# Patient Record
Sex: Male | Born: 1980 | Hispanic: Yes | Marital: Single | Smoking: Never smoker
Health system: Southern US, Community
[De-identification: ages and names within clinical notes are randomized; demographics above are authoritative.]

---

## 2017-06-02 ENCOUNTER — Inpatient Hospital Stay (HOSPITAL_COMMUNITY)
Admission: EM | Admit: 2017-06-02 | Discharge: 2017-06-06 | DRG: 392 | Disposition: A | Discharge status: Home / Self Care | Payer: Self-pay | Attending: Internal Medicine | Admitting: Internal Medicine

## 2017-06-02 ENCOUNTER — Encounter (HOSPITAL_COMMUNITY): Payer: Self-pay

## 2017-06-02 DIAGNOSIS — K5720 Diverticulitis of large intestine with perforation and abscess without bleeding: Principal | ICD-10-CM | POA: Diagnosis present

## 2017-06-02 DIAGNOSIS — D72829 Elevated white blood cell count, unspecified: Secondary | ICD-10-CM | POA: Diagnosis present

## 2017-06-02 DIAGNOSIS — Z789 Other specified health status: Secondary | ICD-10-CM

## 2017-06-02 LAB — CBC
HCT: 43.1 % (ref 39.0–52.0)
HEMOGLOBIN: 14.8 g/dL (ref 13.0–17.0)
MCH: 29.7 pg (ref 26.0–34.0)
MCHC: 34.3 g/dL (ref 30.0–36.0)
MCV: 86.4 fL (ref 78.0–100.0)
PLATELETS: 209 10*3/uL (ref 150–400)
RBC: 4.99 MIL/uL (ref 4.22–5.81)
RDW: 13.2 % (ref 11.5–15.5)
WBC: 11.2 10*3/uL — AB (ref 4.0–10.5)

## 2017-06-02 LAB — COMPREHENSIVE METABOLIC PANEL
ALK PHOS: 95 U/L (ref 38–126)
ALT: 35 U/L (ref 17–63)
ANION GAP: 8 (ref 5–15)
AST: 22 U/L (ref 15–41)
Albumin: 4.1 g/dL (ref 3.5–5.0)
BILIRUBIN TOTAL: 0.5 mg/dL (ref 0.3–1.2)
BUN: 18 mg/dL (ref 6–20)
CO2: 26 mmol/L (ref 22–32)
CREATININE: 0.86 mg/dL (ref 0.61–1.24)
Calcium: 8.9 mg/dL (ref 8.9–10.3)
Chloride: 105 mmol/L (ref 101–111)
Glucose, Bld: 101 mg/dL — ABNORMAL HIGH (ref 65–99)
Potassium: 3.9 mmol/L (ref 3.5–5.1)
SODIUM: 139 mmol/L (ref 135–145)
TOTAL PROTEIN: 8.1 g/dL (ref 6.5–8.1)

## 2017-06-02 LAB — LIPASE, BLOOD: Lipase: 31 U/L (ref 11–51)

## 2017-06-02 MED ORDER — SODIUM CHLORIDE 0.9 % IJ SOLN
INTRAMUSCULAR | Status: AC
  Filled 2017-06-02: qty 50

## 2017-06-02 MED ORDER — SODIUM CHLORIDE 0.9 % IV BOLUS (SEPSIS)
1000.0000 mL | Freq: Once | INTRAVENOUS | Status: AC
  Administered 2017-06-03: 1000 mL via INTRAVENOUS

## 2017-06-02 MED ORDER — IOPAMIDOL (ISOVUE-300) INJECTION 61%
INTRAVENOUS | Status: AC
  Filled 2017-06-02: qty 100

## 2017-06-02 NOTE — ED Notes (Signed)
Pt complains of right quad pain for one week, denies vomiting or diarrhea Pt states that the pain becomes worse after eating and drinking

## 2017-06-02 NOTE — ED Provider Notes (Addendum)
[facility]-EMERGENCY DEPT Provider Note   CSN: [id number] Arrival date & time: 06/02/17  1944     History   Chief Complaint Chief Complaint  Patient presents with  . Abdominal Pain    HPI Willie Robinson is a 37 y.o. male.  HPI Pt has been having lower abdominal pain for the last 4 days.  The pain is in the lower abdomen.  THe pain comes and goes.  He feels like he has a lot of gas.  He does not feel like he has had a good bowel movement.  The pain does not come when he eats but comes an hour or two later. Advil helps but the pain returns. He denies pain with urination.   No vomiting.   Some diarrhea yesterday.  History reviewed. No pertinent past medical history.  There are no active problems to display for this patient.   History reviewed. No pertinent surgical history.     Home Medications    Prior to Admission medications   Not on File    Family History History reviewed. No pertinent family history.  Social History Social History   Tobacco Use  . Smoking status: Never Smoker  . Smokeless tobacco: Never Used  Substance Use Topics  . Alcohol use: No    Frequency: Never  . Drug use: No     Allergies   Patient has no allergy information on record.   Review of Systems Review of Systems  All other systems reviewed and are negative.    Physical Exam Updated Vital Signs BP 126/89 (BP Location: Right Arm)   Pulse 75   Temp 98.2 F (36.8 C) (Oral)   Resp 18   SpO2 100%   Physical Exam  Constitutional: He appears well-developed and well-nourished. No distress.  HENT:  Head: Normocephalic and atraumatic.  Right Ear: External ear normal.  Left Ear: External ear normal.  Eyes: Conjunctivae are normal. Right eye exhibits no discharge. Left eye exhibits no discharge. No scleral icterus.  Neck: Neck supple. No tracheal deviation present.  Cardiovascular: Normal rate, regular rhythm and intact distal pulses.  Pulmonary/Chest:  Effort normal and breath sounds normal. No stridor. No respiratory distress. He has no wheezes. He has no rales.  Abdominal: Soft. Bowel sounds are normal. He exhibits no distension. There is tenderness in the suprapubic area. There is no rebound and no guarding. No hernia.  Musculoskeletal: He exhibits no edema or tenderness.  Neurological: He is alert. He has normal strength. No cranial nerve deficit (no facial droop, extraocular movements intact, no slurred speech) or sensory deficit. He exhibits normal muscle tone. He displays no seizure activity. Coordination normal.  Skin: Skin is warm and dry. No rash noted.  Psychiatric: He has a normal mood and affect.  Nursing note and vitals reviewed.    ED Treatments / Results  Labs (all labs ordered are listed, but only abnormal results are displayed) Labs Reviewed  COMPREHENSIVE METABOLIC PANEL - Abnormal; Notable for the following components:      Result Value   Glucose, Bld 101 (*)    All other components within normal limits  CBC - Abnormal; Notable for the following components:   WBC 11.2 (*)    All other components within normal limits  LIPASE, BLOOD  URINALYSIS, ROUTINE W REFLEX MICROSCOPIC     Radiology Ct Abdomen Pelvis W Contrast  Result Date: 06/03/2017 CLINICAL DATA:  Right lower quadrant pain for 1 week. EXAM: CT ABDOMEN AND PELVIS WITH  CONTRAST TECHNIQUE: Multidetector CT imaging of the abdomen and pelvis was performed using the standard protocol following bolus administration of intravenous contrast. CONTRAST:  100 ml ISOVUE-300 IOPAMIDOL (ISOVUE-300) INJECTION 61% COMPARISON:  None. FINDINGS: Lower chest: There is dependent atelectasis in the lung bases. No pleural or pericardial effusion. Hepatobiliary: The liver is low attenuating consistent with fatty infiltration. No focal lesion. The gallbladder and biliary tree appear normal. Pancreas: Unremarkable. No pancreatic ductal dilatation or surrounding inflammatory changes.  Spleen: Normal in size without focal abnormality. Adrenals/Urinary Tract: Adrenal glands are unremarkable. Kidneys are normal, without renal calculi, focal lesion, or hydronephrosis. Bladder is unremarkable. Stomach/Bowel: The patient has sigmoid diverticulosis. There is wall thickening and stranding about the mid sigmoid colon consistent with superimposed acute diverticulitis. No abscess. A small contained perforation is identified with a pocket of air measuring 2.0 cm craniocaudal by 1.5 cm transverse by 1 cm AP. The stomach, small bowel and appendix appear normal. Vascular/Lymphatic: No significant vascular findings are present. No enlarged abdominal or pelvic lymph nodes. Reproductive: Prostate is unremarkable. Other: No fluid collection.  Fat containing umbilical hernia noted. Musculoskeletal: Negative. IMPRESSION: Acute sigmoid diverticulitis with a small contained perforation identified. Negative for abscess. Fatty infiltration of the liver. Electronically Signed   By: Drusilla Kanner M.D.   On: 06/03/2017 00:21    Procedures Procedures (including critical care time)  Medications Ordered in ED Medications  iopamidol (ISOVUE-300) 61 % injection (not administered)  sodium chloride 0.9 % injection (not administered)  piperacillin-tazobactam (ZOSYN) IVPB 3.375 g (not administered)  sodium chloride 0.9 % bolus 1,000 mL (0 mLs Intravenous Stopped 06/03/17 0052)  iopamidol (ISOVUE-300) 61 % injection 100 mL (100 mLs Intravenous Contrast Given 06/03/17 0009)     Initial Impression / Assessment and Plan / ED Course  I have reviewed the triage vital signs and the nursing notes.  Pertinent labs & imaging results that were available during my care of the patient were reviewed by me and considered in my medical decision making (see chart for details).  Clinical Course as of Jun 04 150  Sat Jun 03, 2017  0143 Discussed with Dr Michaell Cowing.  Agrees with hospitalization considering the degree of perforation.   Surgery will consult on patient.   [JK]  0150 D/w Dr Katrinka Blazing.  Will admit  [JK]    Clinical Course User Index [JK] Linwood Dibbles, MD  Patient presented to the emergency room with complaints of lower abdominal pain associated with change in his bowel habits.  Patient's laboratory tests were notable for elevation his white blood cell count.  CT abdomen and pelvis demonstrated diverticulitis with a small perforation but no evidence of abscess.  Patient is not demonstrating signs of peritonitis.  IV antibiotics were started.  Plan on admission to the hospital for further treatment.  Final Clinical Impressions(s) / ED Diagnoses   Final diagnoses:  Diverticulitis of large intestine with perforation without abscess or bleeding        Linwood Dibbles, MD 06/03/17 [id number]

## 2017-06-03 ENCOUNTER — Encounter (HOSPITAL_COMMUNITY): Payer: Self-pay | Admitting: Radiology

## 2017-06-03 ENCOUNTER — Other Ambulatory Visit: Payer: Self-pay

## 2017-06-03 ENCOUNTER — Emergency Department (HOSPITAL_COMMUNITY): Payer: Self-pay

## 2017-06-03 DIAGNOSIS — K5720 Diverticulitis of large intestine with perforation and abscess without bleeding: Principal | ICD-10-CM

## 2017-06-03 DIAGNOSIS — D72829 Elevated white blood cell count, unspecified: Secondary | ICD-10-CM

## 2017-06-03 DIAGNOSIS — Z789 Other specified health status: Secondary | ICD-10-CM

## 2017-06-03 LAB — CBC
HCT: 39.5 % (ref 39.0–52.0)
HEMOGLOBIN: 13.5 g/dL (ref 13.0–17.0)
MCH: 29.5 pg (ref 26.0–34.0)
MCHC: 34.2 g/dL (ref 30.0–36.0)
MCV: 86.2 fL (ref 78.0–100.0)
Platelets: 191 10*3/uL (ref 150–400)
RBC: 4.58 MIL/uL (ref 4.22–5.81)
RDW: 13.3 % (ref 11.5–15.5)
WBC: 10 10*3/uL (ref 4.0–10.5)

## 2017-06-03 LAB — URINALYSIS, ROUTINE W REFLEX MICROSCOPIC
Bilirubin Urine: NEGATIVE
Glucose, UA: NEGATIVE mg/dL
Hgb urine dipstick: NEGATIVE
Ketones, ur: NEGATIVE mg/dL
Leukocytes, UA: NEGATIVE
NITRITE: NEGATIVE
PH: 6 (ref 5.0–8.0)
Protein, ur: NEGATIVE mg/dL
Specific Gravity, Urine: 1.028 (ref 1.005–1.030)

## 2017-06-03 LAB — BASIC METABOLIC PANEL
ANION GAP: 7 (ref 5–15)
BUN: 11 mg/dL (ref 6–20)
CALCIUM: 8.4 mg/dL — AB (ref 8.9–10.3)
CO2: 25 mmol/L (ref 22–32)
Chloride: 107 mmol/L (ref 101–111)
Creatinine, Ser: 0.7 mg/dL (ref 0.61–1.24)
GLUCOSE: 108 mg/dL — AB (ref 65–99)
POTASSIUM: 3.7 mmol/L (ref 3.5–5.1)
Sodium: 139 mmol/L (ref 135–145)

## 2017-06-03 LAB — MAGNESIUM: Magnesium: 2.2 mg/dL (ref 1.7–2.4)

## 2017-06-03 LAB — HIV ANTIBODY (ROUTINE TESTING W REFLEX): HIV Screen 4th Generation wRfx: NONREACTIVE

## 2017-06-03 MED ORDER — LACTATED RINGERS IV BOLUS (SEPSIS)
1000.0000 mL | Freq: Once | INTRAVENOUS | Status: AC
  Administered 2017-06-03: 1000 mL via INTRAVENOUS

## 2017-06-03 MED ORDER — PHENOL 1.4 % MT LIQD
1.0000 | OROMUCOSAL | Status: DC | PRN
  Filled 2017-06-03: qty 177

## 2017-06-03 MED ORDER — PIPERACILLIN-TAZOBACTAM 3.375 G IVPB 30 MIN
3.3750 g | Freq: Once | INTRAVENOUS | Status: AC
  Administered 2017-06-03: 3.375 g via INTRAVENOUS
  Filled 2017-06-03: qty 50

## 2017-06-03 MED ORDER — MENTHOL 3 MG MT LOZG
1.0000 | LOZENGE | OROMUCOSAL | Status: DC | PRN
  Filled 2017-06-03: qty 9

## 2017-06-03 MED ORDER — ENSURE SURGERY PO LIQD
237.0000 mL | Freq: Two times a day (BID) | ORAL | Status: DC
  Filled 2017-06-03 (×7): qty 237

## 2017-06-03 MED ORDER — ACETAMINOPHEN 325 MG PO TABS: 650.0000 mg | ORAL_TABLET | Freq: Four times a day (QID) | ORAL | Status: DC | PRN

## 2017-06-03 MED ORDER — MAGIC MOUTHWASH
15.0000 mL | Freq: Four times a day (QID) | ORAL | Status: DC | PRN
  Filled 2017-06-03: qty 15

## 2017-06-03 MED ORDER — SODIUM CHLORIDE 0.9 % IV SOLN
INTRAVENOUS | Status: DC
  Administered 2017-06-03 (×2): via INTRAVENOUS

## 2017-06-03 MED ORDER — ALUM & MAG HYDROXIDE-SIMETH 200-200-20 MG/5ML PO SUSP: 30.0000 mL | Freq: Four times a day (QID) | ORAL | Status: DC | PRN

## 2017-06-03 MED ORDER — DIPHENHYDRAMINE HCL 50 MG/ML IJ SOLN: 12.5000 mg | Freq: Four times a day (QID) | INTRAMUSCULAR | Status: DC | PRN

## 2017-06-03 MED ORDER — HYDROCORTISONE 2.5 % RE CREA
1.0000 "application " | TOPICAL_CREAM | Freq: Four times a day (QID) | RECTAL | Status: DC | PRN
  Filled 2017-06-03: qty 28.35

## 2017-06-03 MED ORDER — HYDROCORTISONE 1 % EX CREA
1.0000 "application " | TOPICAL_CREAM | Freq: Three times a day (TID) | CUTANEOUS | Status: DC | PRN
  Filled 2017-06-03: qty 28

## 2017-06-03 MED ORDER — HYDROMORPHONE HCL 1 MG/ML IJ SOLN
0.5000 mg | INTRAMUSCULAR | Status: DC | PRN
  Administered 2017-06-03 (×3): 1 mg via INTRAVENOUS
  Filled 2017-06-03 (×4): qty 1

## 2017-06-03 MED ORDER — LACTATED RINGERS IV BOLUS (SEPSIS): 1000.0000 mL | Freq: Three times a day (TID) | INTRAVENOUS | Status: AC | PRN

## 2017-06-03 MED ORDER — ONDANSETRON HCL 4 MG PO TABS: 4.0000 mg | ORAL_TABLET | Freq: Four times a day (QID) | ORAL | Status: DC | PRN

## 2017-06-03 MED ORDER — PROCHLORPERAZINE EDISYLATE 5 MG/ML IJ SOLN: 5.0000 mg | INTRAMUSCULAR | Status: DC | PRN

## 2017-06-03 MED ORDER — DIPHENHYDRAMINE HCL 25 MG PO CAPS: 25.0000 mg | ORAL_CAPSULE | Freq: Four times a day (QID) | ORAL | Status: DC | PRN

## 2017-06-03 MED ORDER — METHOCARBAMOL 1000 MG/10ML IJ SOLN
1000.0000 mg | Freq: Four times a day (QID) | INTRAVENOUS | Status: DC | PRN
  Filled 2017-06-03: qty 10

## 2017-06-03 MED ORDER — ACETAMINOPHEN 650 MG RE SUPP: 650.0000 mg | Freq: Four times a day (QID) | RECTAL | Status: DC | PRN

## 2017-06-03 MED ORDER — ENOXAPARIN SODIUM 40 MG/0.4ML ~~LOC~~ SOLN
40.0000 mg | Freq: Every day | SUBCUTANEOUS | Status: DC
  Administered 2017-06-03: 40 mg via SUBCUTANEOUS
  Filled 2017-06-03 (×3): qty 0.4

## 2017-06-03 MED ORDER — IOPAMIDOL (ISOVUE-300) INJECTION 61%
100.0000 mL | Freq: Once | INTRAVENOUS | Status: AC | PRN
  Administered 2017-06-03: 100 mL via INTRAVENOUS

## 2017-06-03 MED ORDER — ONDANSETRON HCL 4 MG/2ML IJ SOLN
4.0000 mg | Freq: Four times a day (QID) | INTRAMUSCULAR | Status: DC | PRN
  Administered 2017-06-03: 4 mg via INTRAVENOUS

## 2017-06-03 MED ORDER — PIPERACILLIN-TAZOBACTAM 3.375 G IVPB: 3.3750 g | Freq: Three times a day (TID) | INTRAVENOUS | Status: DC

## 2017-06-03 MED ORDER — LIP MEDEX EX OINT
1.0000 "application " | TOPICAL_OINTMENT | Freq: Two times a day (BID) | CUTANEOUS | Status: DC
  Administered 2017-06-03 – 2017-06-05 (×5): 1 via TOPICAL
  Filled 2017-06-03 (×4): qty 7

## 2017-06-03 MED ORDER — SACCHAROMYCES BOULARDII 250 MG PO CAPS
250.0000 mg | ORAL_CAPSULE | Freq: Two times a day (BID) | ORAL | Status: DC
  Administered 2017-06-03 – 2017-06-06 (×7): 250 mg via ORAL
  Filled 2017-06-03 (×7): qty 1

## 2017-06-03 MED ORDER — PSYLLIUM 95 % PO PACK
1.0000 | PACK | Freq: Every day | ORAL | Status: DC
  Administered 2017-06-03 – 2017-06-05 (×3): 1 via ORAL
  Filled 2017-06-03 (×3): qty 1

## 2017-06-03 MED ORDER — ONDANSETRON HCL 4 MG/2ML IJ SOLN
4.0000 mg | Freq: Four times a day (QID) | INTRAMUSCULAR | Status: DC | PRN
  Filled 2017-06-03: qty 2

## 2017-06-03 MED ORDER — CIPROFLOXACIN IN D5W 400 MG/200ML IV SOLN
400.0000 mg | Freq: Two times a day (BID) | INTRAVENOUS | Status: DC
  Administered 2017-06-03 – 2017-06-05 (×5): 400 mg via INTRAVENOUS
  Filled 2017-06-03 (×5): qty 200

## 2017-06-03 MED ORDER — METRONIDAZOLE IN NACL 5-0.79 MG/ML-% IV SOLN
500.0000 mg | Freq: Three times a day (TID) | INTRAVENOUS | Status: DC
  Administered 2017-06-03 – 2017-06-05 (×7): 500 mg via INTRAVENOUS
  Filled 2017-06-03 (×7): qty 100

## 2017-06-03 MED ORDER — ACETAMINOPHEN 325 MG PO TABS: 325.0000 mg | ORAL_TABLET | Freq: Four times a day (QID) | ORAL | Status: DC | PRN

## 2017-06-03 MED ORDER — MORPHINE SULFATE (PF) 2 MG/ML IV SOLN: 2.0000 mg | INTRAVENOUS | Status: DC | PRN

## 2017-06-03 MED ORDER — GUAIFENESIN-DM 100-10 MG/5ML PO SYRP: 10.0000 mL | ORAL_SOLUTION | ORAL | Status: DC | PRN

## 2017-06-03 MED ORDER — SODIUM CHLORIDE 0.9 % IV SOLN
8.0000 mg | Freq: Four times a day (QID) | INTRAVENOUS | Status: DC | PRN
  Filled 2017-06-03: qty 4

## 2017-06-03 NOTE — Progress Notes (Addendum)
Willie Robinson is a 37 y.o. primarily Spanish-speaking male without significant past medical history;  who presents with complaints of intermittent lower abdominal pain/bloated feeling over the last 4 days. CT scan of the abdomen revealed signs of a sigmoid diverticulitis with a small contained perforation.   Plan: 1. Observation with IV antibiotics, IV fluids and pain control.  2. Appreciate surgery recommendations and surgery has graciously agreed to take the patient on their service.    [name], MD [phone]

## 2017-06-03 NOTE — ED Notes (Signed)
ED TO INPATIENT HANDOFF REPORT  Name/Age/Gender Willie Robinson 37 y.o. male  Code Status    Code Status Orders  (From admission, onward)        Start     Ordered   06/03/17 0149  Full code  Continuous     06/03/17 0150    Code Status History    Date Active Date Inactive Code Status Order ID Comments User Context   This patient has a current code status but no historical code status.      Home/SNF/Other Home  Chief Complaint lower abdmonial pain; pain during urination  Level of Care/Admitting Diagnosis ED Disposition    ED Disposition Condition [facility] Area: [place] [[id number]]  Level of Care: Med-Surg [16]  Diagnosis: Perforation of sigmoid colon due to diverticulitis [5638937]  Admitting Physician: Norval Morton [[id number]]  Attending Physician: Norval Morton [[id number]]  Estimated length of stay: past midnight tomorrow  Certification:: I certify this patient will need inpatient services for at least 2 midnights  PT Class (Do Not Modify): Inpatient [101]  PT Acc Code (Do Not Modify): Private [1]       Medical History History reviewed. No pertinent past medical history.  Allergies No Known Allergies  IV Location/Drains/Wounds Patient Lines/Drains/Airways Status   Active Line/Drains/Airways    Name:   Placement date:   Placement time:   Site:   Days:   Peripheral IV 06/03/17 Left Forearm   06/03/17    0000    Forearm   less than 1          Labs/Imaging Results for orders placed or performed during the hospital encounter of 06/02/17 (from the past 48 hour(s))  Lipase, blood     Status: None   Collection Time: 06/02/17  9:02 PM  Result Value Ref Range   Lipase 31 11 - 51 U/L    Comment: Performed at [facility], [place] [address], [place], [place] [address]  Comprehensive metabolic panel     Status: Abnormal   Collection Time: 06/02/17  9:02 PM  Result Value Ref Range   Sodium 139 135 - 145  mmol/L   Potassium 3.9 3.5 - 5.1 mmol/L   Chloride 105 101 - 111 mmol/L   CO2 26 22 - 32 mmol/L   Glucose, Bld 101 (H) 65 - 99 mg/dL   BUN 18 6 - 20 mg/dL   Creatinine, Ser 0.86 0.61 - 1.24 mg/dL   Calcium 8.9 8.9 - 10.3 mg/dL   Total Protein 8.1 6.5 - 8.1 g/dL   Albumin 4.1 3.5 - 5.0 g/dL   AST 22 15 - 41 U/L   ALT 35 17 - 63 U/L   Alkaline Phosphatase 95 38 - 126 U/L   Total Bilirubin 0.5 0.3 - 1.2 mg/dL   GFR calc non Af Amer >60 >60 mL/min   GFR calc Af Amer >60 >60 mL/min    Comment: (NOTE) The eGFR has been calculated using the CKD EPI equation. This calculation has not been validated in all clinical situations. eGFR's persistently <60 mL/min signify possible Chronic Kidney Disease.    Anion gap 8 5 - 15    Comment: Performed at [facility], [place] [address] [place],  [address]  CBC     Status: Abnormal   Collection Time: 06/02/17  9:02 PM  Result Value Ref Range   WBC 11.2 (H) 4.0 - 10.5 K/uL   RBC 4.99 4.22 -  5.81 MIL/uL   Hemoglobin 14.8 13.0 - 17.0 g/dL   HCT 43.1 39.0 - 52.0 %   MCV 86.4 78.0 - 100.0 fL   MCH 29.7 26.0 - 34.0 pg   MCHC 34.3 30.0 - 36.0 g/dL   RDW 13.2 11.5 - 15.5 %   Platelets 209 150 - 400 K/uL    Comment: Performed at [facility], [place] [address] [place], [place] [address]  Magnesium     Status: None   Collection Time: 06/02/17  9:02 PM  Result Value Ref Range   Magnesium 2.2 1.7 - 2.4 mg/dL    Comment: Performed at [facility], [place] [address], [place], [place] [address]  Urinalysis, Routine w reflex microscopic     Status: None   Collection Time: 06/03/17 12:48 AM  Result Value Ref Range   Color, Urine YELLOW YELLOW   APPearance CLEAR CLEAR   Specific Gravity, Urine 1.028 1.005 - 1.030   pH 6.0 5.0 - 8.0   Glucose, UA NEGATIVE NEGATIVE mg/dL   Hgb urine dipstick NEGATIVE NEGATIVE   Bilirubin Urine NEGATIVE NEGATIVE   Ketones, ur NEGATIVE NEGATIVE mg/dL   Protein, ur  NEGATIVE NEGATIVE mg/dL   Nitrite NEGATIVE NEGATIVE   Leukocytes, UA NEGATIVE NEGATIVE    Comment: Performed at [place] [address] [place], [place] [address]   Ct Abdomen Pelvis W Contrast  Result Date: 06/03/2017 CLINICAL DATA:  Right lower quadrant pain for 1 week. EXAM: CT ABDOMEN AND PELVIS WITH CONTRAST TECHNIQUE: Multidetector CT imaging of the abdomen and pelvis was performed using the standard protocol following bolus administration of intravenous contrast. CONTRAST:  100 ml ISOVUE-300 IOPAMIDOL (ISOVUE-300) INJECTION 61% COMPARISON:  None. FINDINGS: Lower chest: There is dependent atelectasis in the lung bases. No pleural or pericardial effusion. Hepatobiliary: The liver is low attenuating consistent with fatty infiltration. No focal lesion. The gallbladder and biliary tree appear normal. Pancreas: Unremarkable. No pancreatic ductal dilatation or surrounding inflammatory changes. Spleen: Normal in size without focal abnormality. Adrenals/Urinary Tract: Adrenal glands are unremarkable. Kidneys are normal, without renal calculi, focal lesion, or hydronephrosis. Bladder is unremarkable. Stomach/Bowel: The patient has sigmoid diverticulosis. There is wall thickening and stranding about the mid sigmoid colon consistent with superimposed acute diverticulitis. No abscess. A small contained perforation is identified with a pocket of air measuring 2.0 cm craniocaudal by 1.5 cm transverse by 1 cm AP. The stomach, small bowel and appendix appear normal. Vascular/Lymphatic: No significant vascular findings are present. No enlarged abdominal or pelvic lymph nodes. Reproductive: Prostate is unremarkable. Other: No fluid collection.  Fat containing umbilical hernia noted. Musculoskeletal: Negative. IMPRESSION: Acute sigmoid diverticulitis with a small contained perforation identified. Negative for abscess. Fatty infiltration of the liver. Electronically Signed   By: Inge Rise  M.D.   On: 06/03/2017 00:21    Pending Labs Unresulted Labs (From admission, onward)   Start     Ordered   06/03/17 0500  CBC  Tomorrow morning,   R    Question:  Specimen collection method  Answer:  IV Team   06/03/17 0148   06/03/17 0500  HIV antibody (Routine Testing)  Tomorrow morning,   R     06/03/17 0150   06/03/17 2010  Basic metabolic panel  Tomorrow morning,   R     06/03/17 0150      Vitals/Pain Today's Vitals   06/02/17 2054 06/02/17 2311 06/03/17 0023 06/03/17 0226  BP:   126/89 124/84  Pulse:  75 88  Resp:   18 18  Temp:   98.2 F (36.8 C)   TempSrc:   Oral   SpO2:   100% 98%  PainSc: 9  9       Isolation Precautions No active isolations  Medications Medications  iopamidol (ISOVUE-300) 61 % injection (not administered)  sodium chloride 0.9 % injection (not administered)  lactated ringers bolus 1,000 mL (1,000 mLs Intravenous New Bag/Given 06/03/17 0158)  lactated ringers bolus 1,000 mL (not administered)  HYDROmorphone (DILAUDID) injection 0.5-2 mg (not administered)  methocarbamol (ROBAXIN) 1,000 mg in dextrose 5 % 50 mL IVPB (not administered)  ondansetron (ZOFRAN) injection 4 mg (not administered)    Or  ondansetron (ZOFRAN) 8 mg in sodium chloride 0.9 % 50 mL IVPB (not administered)  prochlorperazine (COMPAZINE) injection 5-10 mg (not administered)  lip balm (CARMEX) ointment 1 application (not administered)  magic mouthwash (not administered)  guaiFENesin-dextromethorphan (ROBITUSSIN DM) 100-10 MG/5ML syrup 10 mL (not administered)  hydrocortisone (ANUSOL-HC) 2.5 % rectal cream 1 application (not administered)  alum & mag hydroxide-simeth (MAALOX/MYLANTA) 200-200-20 MG/5ML suspension 30 mL (not administered)  hydrocortisone cream 1 % 1 application (not administered)  menthol-cetylpyridinium (CEPACOL) lozenge 3 mg (not administered)  phenol (CHLORASEPTIC) mouth spray 1-2 spray (not administered)  diphenhydrAMINE (BENADRYL) capsule 25 mg (not  administered)  diphenhydrAMINE (BENADRYL) injection 12.5-25 mg (not administered)  enoxaparin (LOVENOX) injection 40 mg (not administered)  0.9 %  sodium chloride infusion ( Intravenous New Bag/Given 06/03/17 0159)  ondansetron (ZOFRAN) tablet 4 mg (not administered)    Or  ondansetron (ZOFRAN) injection 4 mg (not administered)  acetaminophen (TYLENOL) tablet 650 mg (not administered)    Or  acetaminophen (TYLENOL) suppository 650 mg (not administered)  morphine 2 MG/ML injection 2 mg (not administered)  ciprofloxacin (CIPRO) IVPB 400 mg (not administered)  metroNIDAZOLE (FLAGYL) IVPB 500 mg (not administered)  sodium chloride 0.9 % bolus 1,000 mL (0 mLs Intravenous Stopped 06/03/17 0052)  iopamidol (ISOVUE-300) 61 % injection 100 mL (100 mLs Intravenous Contrast Given 06/03/17 0009)  piperacillin-tazobactam (ZOSYN) IVPB 3.375 g (0 g Intravenous Stopped 06/03/17 0152)    Mobility walks

## 2017-06-03 NOTE — Progress Notes (Signed)
Pharmacy Antibiotic Note  Willie Robinson is a 37 y.o. male admitted on 06/02/2017 with Intra-abdominal infection.  Pharmacy has been consulted for zosyn dosing.  Plan: Zosyn 3.375g IV q8h (4 hour infusion).     Temp (24hrs), Avg:98.2 F (36.8 C), Min:98.1 F (36.7 C), Max:98.2 F (36.8 C)  Recent Labs  Lab 06/02/17 2102  WBC 11.2*  CREATININE 0.86    CrCl cannot be calculated (Unknown ideal weight.).    No Known Allergies  Antimicrobials this admission:  Zosyn 06/03/2017 >>   Dose adjustments this admission:   Microbiology results: pending  Thank you for allowing pharmacy to be a part of this patient's care.  Aleene DavidsonGrimsley Jr, Azhar Knope Crowford 06/03/2017 2:13 AM

## 2017-06-03 NOTE — H&P (Addendum)
History and Physical    Willie Robinson ZOX:[id number] DOB: [birth year] DOA: 06/02/2017  Referring MD/NP/PA: Dr. Lynelle Doctor PCP: Patient, [name]  Patient coming from: Home   Chief Complaint: Abdominal pain  I have personally briefly reviewed patient's old medical records in [place] [facility]   HPI: Willie Robinson is a 37 y.o. primarily Spanish-speaking male without significant past medical history;  who presents with complaints of intermittent lower abdominal pain/bloated feeling over the last 4 days.  He initially thought that he was constipated.  Tried taking Aleve, eating fruits, and fiber without relief.  Associated symptoms include malaise, nausea, dysuria, and loose stools.  Denies any fever, chills, vomiting, chest pain, or noted blood in urine/stool.  He reports never having similar symptoms like this previously in the past.  ED Course: Upon admission into the emergency department patient had normal vital signs.  Labs revealed WBC of 11.2, and all other labs relatively within normal limits including lipase.  Analysis was negative for signs of infection.  CT scan of the abdomen revealed signs of a sigmoid diverticulitis with a small contained perforation.  TRH called to admit.  Review of Systems  Constitutional: Positive for malaise/fatigue. Negative for chills and fever.  HENT: Negative for ear discharge and nosebleeds.   Eyes: Negative for double vision and photophobia.  Respiratory: Negative for cough and sputum production.   Cardiovascular: Negative for chest pain and leg swelling.  Gastrointestinal: Positive for abdominal pain and nausea. Negative for vomiting.  Genitourinary: Positive for dysuria. Negative for hematuria.  Musculoskeletal: Negative for back pain and myalgias.  Skin: Negative for itching and rash.  Neurological: Negative for focal weakness and seizures.  Endo/Heme/Allergies: Negative for environmental allergies and polydipsia.  Psychiatric/Behavioral: Negative  for depression and substance abuse.    History reviewed. No pertinent past medical history.  History reviewed. No pertinent surgical history.   reports that  has never smoked. he has never used smokeless tobacco. He reports that he does not drink alcohol or use drugs.  No Known Allergies  Family History  Problem Relation Age of Onset  . Diabetes Mother   . Depression Father   . Diabetes Maternal Grandmother     Prior to Admission medications   Not on File    Physical Exam:  Constitutional: NAD, calm, comfortable Vitals:   06/02/17 2029 06/03/17 0023  BP: 118/81 126/89  Pulse: 91 75  Resp: 16 18  Temp: 98.1 F (36.7 C) 98.2 F (36.8 C)  TempSrc: Oral Oral  SpO2: 96% 100%   Eyes: PERRL, lids and conjunctivae normal ENMT: Mucous membranes are moist. Posterior pharynx clear of any exudate or lesions.Normal dentition.  Neck: normal, supple, no masses, no thyromegaly Respiratory: clear to auscultation bilaterally, no wheezing, no crackles. Normal respiratory effort. No accessory muscle use.  Cardiovascular: Regular rate and rhythm, no murmurs / rubs / gallops. No extremity edema. 2+ pedal pulses. No carotid bruits.  Abdomen: Lower abdominal tenderness to palpation, no masses palpated. No hepatosplenomegaly. Bowel sounds positive.  Musculoskeletal: no clubbing / cyanosis. No joint deformity upper and lower extremities. Good ROM, no contractures. Normal muscle tone.  Skin: no rashes, lesions, ulcers. No induration Neurologic: CN 2-12 grossly intact. Sensation intact, DTR normal. Strength 5/5 in all 4.  Psychiatric: Normal judgment and insight. Alert and oriented x 3. Normal mood.     Labs on Admission: I have personally reviewed following labs and imaging studies  CBC: Recent Labs  Lab 06/02/17 2102  WBC 11.2*  HGB 14.8  HCT 43.1  MCV 86.4  PLT 209   Basic Metabolic Panel: Recent Labs  Lab 06/02/17 2102  NA 139  K 3.9  CL 105  CO2 26  GLUCOSE 101*  BUN 18   CREATININE 0.86  CALCIUM 8.9   GFR: CrCl cannot be calculated (Unknown ideal weight.). Liver Function Tests: Recent Labs  Lab 06/02/17 2102  AST 22  ALT 35  ALKPHOS 95  BILITOT 0.5  PROT 8.1  ALBUMIN 4.1   Recent Labs  Lab 06/02/17 2102  LIPASE 31   No results for input(s): AMMONIA in the last 168 hours. Coagulation Profile: No results for input(s): INR, PROTIME in the last 168 hours. Cardiac Enzymes: No results for input(s): CKTOTAL, CKMB, CKMBINDEX, TROPONINI in the last 168 hours. BNP (last 3 results) No results for input(s): PROBNP in the last 8760 hours. HbA1C: No results for input(s): HGBA1C in the last 72 hours. CBG: No results for input(s): GLUCAP in the last 168 hours. Lipid Profile: No results for input(s): CHOL, HDL, LDLCALC, TRIG, CHOLHDL, LDLDIRECT in the last 72 hours. Thyroid Function Tests: No results for input(s): TSH, T4TOTAL, FREET4, T3FREE, THYROIDAB in the last 72 hours. Anemia Panel: No results for input(s): VITAMINB12, FOLATE, FERRITIN, TIBC, IRON, RETICCTPCT in the last 72 hours. Urine analysis:    Component Value Date/Time   COLORURINE YELLOW 06/03/2017 0048   APPEARANCEUR CLEAR 06/03/2017 0048   LABSPEC 1.028 06/03/2017 0048   PHURINE 6.0 06/03/2017 0048   GLUCOSEU NEGATIVE 06/03/2017 0048   HGBUR NEGATIVE 06/03/2017 0048   BILIRUBINUR NEGATIVE 06/03/2017 0048   KETONESUR NEGATIVE 06/03/2017 0048   PROTEINUR NEGATIVE 06/03/2017 0048   NITRITE NEGATIVE 06/03/2017 0048   LEUKOCYTESUR NEGATIVE 06/03/2017 0048   Sepsis Labs: No results found for this or any previous visit (from the past 240 hour(s)).   Radiological Exams on Admission: Ct Abdomen Pelvis W Contrast  Result Date: 06/03/2017 CLINICAL DATA:  Right lower quadrant pain for 1 week. EXAM: CT ABDOMEN AND PELVIS WITH CONTRAST TECHNIQUE: Multidetector CT imaging of the abdomen and pelvis was performed using the standard protocol following bolus administration of intravenous  contrast. CONTRAST:  100 ml ISOVUE-300 IOPAMIDOL (ISOVUE-300) INJECTION 61% COMPARISON:  None. FINDINGS: Lower chest: There is dependent atelectasis in the lung bases. No pleural or pericardial effusion. Hepatobiliary: The liver is low attenuating consistent with fatty infiltration. No focal lesion. The gallbladder and biliary tree appear normal. Pancreas: Unremarkable. No pancreatic ductal dilatation or surrounding inflammatory changes. Spleen: Normal in size without focal abnormality. Adrenals/Urinary Tract: Adrenal glands are unremarkable. Kidneys are normal, without renal calculi, focal lesion, or hydronephrosis. Bladder is unremarkable. Stomach/Bowel: The patient has sigmoid diverticulosis. There is wall thickening and stranding about the mid sigmoid colon consistent with superimposed acute diverticulitis. No abscess. A small contained perforation is identified with a pocket of air measuring 2.0 cm craniocaudal by 1.5 cm transverse by 1 cm AP. The stomach, small bowel and appendix appear normal. Vascular/Lymphatic: No significant vascular findings are present. No enlarged abdominal or pelvic lymph nodes. Reproductive: Prostate is unremarkable. Other: No fluid collection.  Fat containing umbilical hernia noted. Musculoskeletal: Negative. IMPRESSION: Acute sigmoid diverticulitis with a small contained perforation identified. Negative for abscess. Fatty infiltration of the liver. Electronically Signed   By: Drusilla Kanner M.D.   On: 06/03/2017 00:21    Personally reviewed CT scan showing inflammation around the sigmoid colon.  Assessment/Plan Sigmoid diverticulitis: Acute.  Patient presents with lower abdominal discomfort.  CT scan shows acute sigmoid  diverticulitis with a small contained perforation identified. - Admit to MedSurg bed - N.p.o. - IV fluids of normal saline at 100 mL/h - Empiric antibiotics of ciprofloxacin and Flagyl - Morphine IV prn pain - Appreciate general surgery consultative  service   Leukocytosis WBC elevated 11.2.  No other systemic symptoms appreciated - Recheck WBC in a.m.   DVT prophylaxis: lovenox   Code Status: Full  Family Communication: No family present at bedside Disposition Plan: Likely discharge home in 2-3 days Consults called: General surgery Admission status: inpatient  Clydie Braunondell A Mubarak Bevens MD [facility] Pager [phone]   If 7PM-7AM, please contact night-coverage [email or web address] Password [email or web address]  06/03/2017, 1:36 AM

## 2017-06-03 NOTE — Consult Note (Signed)
[address] [place], [place] [address] Phone: [phone] FAX: [place]  1981/01/09 [id number]  CARE TEAM:  PCP: Patient, [name]  Outpatient Care Team: Patient Care Team: Patient, [name] as PCP - General (General Practice)  Inpatient Treatment Team: Treatment Team: Attending Provider: Dorie Rank, MD; Respiratory Therapist: Nelly Laurence, RRT; Registered Nurse: Susette Racer, RN; Rounding Team: Jackelyn Knife, MD; Consulting Physician: Edison Pace, Md, MD   This patient is a 37 y.o.male who presents today for surgical evaluation at the request of Dr Dorie Rank.   Chief complaint / Reason for evaluation: Sigmoid diverticulitis with perforation.  Young Hispanic male.  Bilingual but English is not his first language.  4 days of worsening abdominal pain and crampiness.  Feeling bloated.  Decreased bowel movements.  Try to help move his bowels with additional fiber supplements and fruits.  Not much relief.  Tried over-the-counter pain medications.  Worsening nausea.  Eventually started having loose bowel movements.  Feeling tired.  Discomfort to strain to urinate.  Worsening symptoms.  Came in the emergency room.  CT scan revealed moderate pocket of gas in inflamed sigmoid colon concerning for perforated diverticulitis.  Patient denies any worsening pain.  Mildly elevated white count.  Surgical consultation requested.  No personal nor family history of GI/colon cancer, inflammatory bowel disease, irritable bowel syndrome, allergy such as Celiac Sprue, dietary/dairy problems, colitis, ulcers nor gastritis.  No recent sick contacts/gastroenteritis.  No travel outside the country.  No changes in diet.  No dysphagia to solids or liquids.  No significant heartburn or reflux.  No hematochezia, hematemesis, coffee ground emesis.  No evidence of prior gastric/peptic ulceration.  He is not a diabetic.  He is  not a smoker.  She never had a colonoscopy.  No strong family history of bowel problems.    Assessment  Willie Robinson  37 y.o. male       Problem List:  Principal Problem:   Diverticulitis of colon with perforation Active Problems:   Uses Spanish as primary spoken language   Sigmoid diverticulitis with moderate pocket of gas.  Appears to be walled off and not particularly sickly but with some moderate inflammation.  Plan:  Admission.   He is relatively otherwise healthy, so most likely we with surgery can manage the patient on her own.  See if medicine agrees  IV antibiotics.  IV fluids.  Follow clinical course.  If leukocytosis and pain resolve, transition to diet and outpatient antibiotics.  He may benefit from colonoscopy to rule out any other abnormalities but would hold off since seems like a straightforward diverticulitis he does not have a strong family history..  Since this is his first attack and it is moderate but not severely complex, this does not necessarily mandate colectomy now or in the future.  However, if he does not improve on antibiotics then he may need repeat CAT scan with possible drainage of abscess versus emergency Hartman bowel resection should worsen rapidly and does not improve with bowel rest and IV antibiotics.  Low-fat high-fiber diet indefinitely.  Consider fiber supplement daily indefinitely  -VTE prophylaxis- SCDs, etc -mobilize as tolerated to help recovery  30 minutes spent in review, evaluation, examination, counseling, and coordination of care.  More than 50% of that time was spent in counseling.  Adin Hector, M.D., F.A.C.S. Gastrointestinal and Minimally Invasive Surgery [facility]  [address] [place], [place] [address] [phone] Main / Paging   06/03/2017      History reviewed. No pertinent past medical history.  History reviewed. No pertinent surgical history.  Social History    Socioeconomic History  . Marital status: Single    Spouse name: Not on file  . Number of children: Not on file  . Years of education: Not on file  . Highest education level: Not on file  Social Needs  . Financial resource strain: Not on file  . Food insecurity - worry: Not on file  . Food insecurity - inability: Not on file  . Transportation needs - medical: Not on file  . Transportation needs - non-medical: Not on file  Occupational History  . Not on file  Tobacco Use  . Smoking status: Never Smoker  . Smokeless tobacco: Never Used  Substance and Sexual Activity  . Alcohol use: No    Frequency: Never  . Drug use: No  . Sexual activity: Not on file  Other Topics Concern  . Not on file  Social History Narrative  . Not on file    History reviewed. No pertinent family history.  Current Facility-Administered Medications  Medication Dose Route Frequency Provider Last Rate Last Dose  . acetaminophen (TYLENOL) tablet 325-650 mg  325-650 mg Oral Q6H PRN Michael Boston, MD      . alum & mag hydroxide-simeth (MAALOX/MYLANTA) 200-200-20 MG/5ML suspension 30 mL  30 mL Oral Q6H PRN Michael Boston, MD      . diphenhydrAMINE (BENADRYL) capsule 25 mg  25 mg Oral Q6H PRN Michael Boston, MD      . diphenhydrAMINE (BENADRYL) injection 12.5-25 mg  12.5-25 mg Intravenous Q6H PRN Michael Boston, MD      . guaiFENesin-dextromethorphan (ROBITUSSIN DM) 100-10 MG/5ML syrup 10 mL  10 mL Oral Q4H PRN Michael Boston, MD      . hydrocortisone (ANUSOL-HC) 2.5 % rectal cream 1 application  1 application Topical QID PRN Michael Boston, MD      . hydrocortisone cream 1 % 1 application  1 application Topical TID PRN Michael Boston, MD      . HYDROmorphone (DILAUDID) injection 0.5-2 mg  0.5-2 mg Intravenous Q2H PRN Michael Boston, MD      . iopamidol (ISOVUE-300) 61 % injection           . lactated ringers bolus 1,000 mL  1,000 mL Intravenous Once Michael Boston, MD      . lactated ringers bolus 1,000 mL  1,000 mL  Intravenous Q8H PRN Caydyn Sprung, Remo Lipps, MD      . lip balm (CARMEX) ointment 1 application  1 application Topical BID Michael Boston, MD      . magic mouthwash  15 mL Oral QID PRN Michael Boston, MD      . menthol-cetylpyridinium (CEPACOL) lozenge 3 mg  1 lozenge Oral PRN Michael Boston, MD      . methocarbamol (ROBAXIN) 1,000 mg in dextrose 5 % 50 mL IVPB  1,000 mg Intravenous Q6H PRN Michael Boston, MD      . ondansetron [facility]) injection 4 mg  4 mg Intravenous Q6H PRN Michael Boston, MD       Or  . ondansetron (ZOFRAN) 8 mg in sodium chloride 0.9 % 50 mL IVPB  8 mg Intravenous Q6H PRN Michael Boston, MD      . phenol (CHLORASEPTIC) mouth spray 1-2 spray  1-2 spray Mouth/Throat PRN Michael Boston, MD      .  piperacillin-tazobactam (ZOSYN) IVPB 3.375 g  3.375 g Intravenous Once Dorie Rank, MD 100 mL/hr at 06/03/17 0123 3.375 g at 06/03/17 0123  . prochlorperazine (COMPAZINE) injection 5-10 mg  5-10 mg Intravenous Q4H PRN Michael Boston, MD      . sodium chloride 0.9 % injection            No current outpatient medications on file.     Not on File  ROS:   All other systems reviewed & are negative except per HPI or as noted below: Constitutional:  No fevers, chills, sweats.  Weight stable Eyes:  No vision changes, No discharge HENT:  No sore throats, nasal drainage Lymph: No neck swelling, No bruising easily Pulmonary:  No cough, productive sputum CV: No orthopnea, PND  Patient walks 120 minutes for about 3 miles without difficulty.  No exertional chest/neck/shoulder/arm pain. GI: No personal nor family history of GI/colon cancer, inflammatory bowel disease, irritable bowel syndrome, allergy such as Celiac Sprue, dietary/dairy problems, colitis, ulcers nor gastritis.  No recent sick contacts/gastroenteritis.  No travel outside the country.  No changes in diet. Renal: No UTIs, No hematuria Genital:  No drainage, bleeding, masses Musculoskeletal: No severe joint pain.  Good ROM major joints Skin:  No  sores or lesions.  No rashes Heme/Lymph:  No easy bleeding.  No swollen lymph nodes Neuro: No focal weakness/numbness.  No seizures Psych: No suicidal ideation.  No hallucinations  BP 126/89 (BP Location: Right Arm)   Pulse 75   Temp 98.2 F (36.8 C) (Oral)   Resp 18   SpO2 100%   Physical Exam: General: Pt awakens oriented x4 in no major acute distress.  Tired but not toxic Eyes: PERRL, normal EOM. Sclera nonicteric Neuro: CN II-XII intact w/o focal sensory/motor deficits. Lymph: No head/neck/groin lymphadenopathy Psych:  No delerium/psychosis/paranoia HENT: Normocephalic, Mucus membranes moist.  No thrush Neck: Supple, No tracheal deviation Chest: No pain.  Good respiratory excursion. CV:  Pulses intact.  Regular rhythm Abdomen: Soft, Nondistended.  Tenderness to palpation in lower abdomen, especially suprapubic region.  Some peritoneal irritation but no diffuse peritonitis.  Nontender.  No incarcerated hernias. Gen:  No inguinal hernias.  No inguinal lymphadenopathy.   Ext:  SCDs BLE.  No significant edema.  No cyanosis Skin: No petechiae / purpurea.  No major sores Musculoskeletal: No severe joint pain.  Good ROM major joints   Results:   Labs: Results for orders placed or performed during the hospital encounter of 06/02/17 (from the past 48 hour(s))  Lipase, blood     Status: None   Collection Time: 06/02/17  9:02 PM  Result Value Ref Range   Lipase 31 11 - 51 U/L    Comment: Performed at [facility], [place] [address], [place], [place] [address]  Comprehensive metabolic panel     Status: Abnormal   Collection Time: 06/02/17  9:02 PM  Result Value Ref Range   Sodium 139 135 - 145 mmol/L   Potassium 3.9 3.5 - 5.1 mmol/L   Chloride 105 101 - 111 mmol/L   CO2 26 22 - 32 mmol/L   Glucose, Bld 101 (H) 65 - 99 mg/dL   BUN 18 6 - 20 mg/dL   Creatinine, Ser 0.86 0.61 - 1.24 mg/dL   Calcium 8.9 8.9 - 10.3 mg/dL   Total Protein 8.1 6.5 - 8.1 g/dL    Albumin 4.1 3.5 - 5.0 g/dL   AST 22 15 - 41 U/L   ALT 35 17 -  63 U/L   Alkaline Phosphatase 95 38 - 126 U/L   Total Bilirubin 0.5 0.3 - 1.2 mg/dL   GFR calc non Af Amer >60 >60 mL/min   GFR calc Af Amer >60 >60 mL/min    Comment: (NOTE) The eGFR has been calculated using the CKD EPI equation. This calculation has not been validated in all clinical situations. eGFR's persistently <60 mL/min signify possible Chronic Kidney Disease.    Anion gap 8 5 - 15    Comment: Performed at [facility], [place] [address], [place], [place] [address]  CBC     Status: Abnormal   Collection Time: 06/02/17  9:02 PM  Result Value Ref Range   WBC 11.2 (H) 4.0 - 10.5 K/uL   RBC 4.99 4.22 - 5.81 MIL/uL   Hemoglobin 14.8 13.0 - 17.0 g/dL   HCT 43.1 39.0 - 52.0 %   MCV 86.4 78.0 - 100.0 fL   MCH 29.7 26.0 - 34.0 pg   MCHC 34.3 30.0 - 36.0 g/dL   RDW 13.2 11.5 - 15.5 %   Platelets 209 150 - 400 K/uL    Comment: Performed at [facility], [place] [address], [place], [place] [address]  Urinalysis, Routine w reflex microscopic     Status: None   Collection Time: 06/03/17 12:48 AM  Result Value Ref Range   Color, Urine YELLOW YELLOW   APPearance CLEAR CLEAR   Specific Gravity, Urine 1.028 1.005 - 1.030   pH 6.0 5.0 - 8.0   Glucose, UA NEGATIVE NEGATIVE mg/dL   Hgb urine dipstick NEGATIVE NEGATIVE   Bilirubin Urine NEGATIVE NEGATIVE   Ketones, ur NEGATIVE NEGATIVE mg/dL   Protein, ur NEGATIVE NEGATIVE mg/dL   Nitrite NEGATIVE NEGATIVE   Leukocytes, UA NEGATIVE NEGATIVE    Comment: Performed at [place] [address], [place], [place] [address]    Imaging / Studies: Ct Abdomen Pelvis W Contrast  Result Date: 06/03/2017 CLINICAL DATA:  Right lower quadrant pain for 1 week. EXAM: CT ABDOMEN AND PELVIS WITH CONTRAST TECHNIQUE: Multidetector CT imaging of the abdomen and pelvis was performed using the standard protocol following bolus administration  of intravenous contrast. CONTRAST:  100 ml ISOVUE-300 IOPAMIDOL (ISOVUE-300) INJECTION 61% COMPARISON:  None. FINDINGS: Lower chest: There is dependent atelectasis in the lung bases. No pleural or pericardial effusion. Hepatobiliary: The liver is low attenuating consistent with fatty infiltration. No focal lesion. The gallbladder and biliary tree appear normal. Pancreas: Unremarkable. No pancreatic ductal dilatation or surrounding inflammatory changes. Spleen: Normal in size without focal abnormality. Adrenals/Urinary Tract: Adrenal glands are unremarkable. Kidneys are normal, without renal calculi, focal lesion, or hydronephrosis. Bladder is unremarkable. Stomach/Bowel: The patient has sigmoid diverticulosis. There is wall thickening and stranding about the mid sigmoid colon consistent with superimposed acute diverticulitis. No abscess. A small contained perforation is identified with a pocket of air measuring 2.0 cm craniocaudal by 1.5 cm transverse by 1 cm AP. The stomach, small bowel and appendix appear normal. Vascular/Lymphatic: No significant vascular findings are present. No enlarged abdominal or pelvic lymph nodes. Reproductive: Prostate is unremarkable. Other: No fluid collection.  Fat containing umbilical hernia noted. Musculoskeletal: Negative. IMPRESSION: Acute sigmoid diverticulitis with a small contained perforation identified. Negative for abscess. Fatty infiltration of the liver. Electronically Signed   By: Inge Rise M.D.   On: 06/03/2017 00:21    Medications / Allergies: per chart  Antibiotics: Anti-infectives (From admission, onward)   Start     Dose/Rate  Route Frequency Ordered Stop   06/03/17 0115  piperacillin-tazobactam (ZOSYN) IVPB 3.375 g     3.375 g 100 mL/hr over 30 Minutes Intravenous  Once 06/03/17 0109          Note: Portions of this report may have been transcribed using voice recognition software. Every effort was made to ensure accuracy; however, inadvertent  computerized transcription errors may be present.   Any transcriptional errors that result from this process are unintentional.    Adin Hector, M.D., F.A.C.S. Gastrointestinal and Minimally Invasive Surgery [facility] [address], [place], [place] [address] [phone] Main / Paging   06/03/2017

## 2017-06-04 MED ORDER — METHOCARBAMOL 1000 MG/10ML IJ SOLN
1000.0000 mg | Freq: Four times a day (QID) | INTRAVENOUS | Status: DC | PRN
  Filled 2017-06-04: qty 10

## 2017-06-04 MED ORDER — ACETAMINOPHEN 325 MG PO TABS: 325.0000 mg | ORAL_TABLET | Freq: Four times a day (QID) | ORAL | Status: DC | PRN

## 2017-06-04 MED ORDER — HYDROCORTISONE 1 % EX CREA: 1.0000 "application " | TOPICAL_CREAM | Freq: Three times a day (TID) | CUTANEOUS | Status: DC | PRN

## 2017-06-04 MED ORDER — LACTATED RINGERS IV BOLUS (SEPSIS): 1000.0000 mL | Freq: Three times a day (TID) | INTRAVENOUS | Status: AC | PRN

## 2017-06-04 MED ORDER — SODIUM CHLORIDE 0.9% FLUSH: 3.0000 mL | INTRAVENOUS | Status: DC | PRN

## 2017-06-04 MED ORDER — GABAPENTIN 300 MG PO CAPS
300.0000 mg | ORAL_CAPSULE | Freq: Every day | ORAL | Status: DC
  Administered 2017-06-04 – 2017-06-05 (×2): 300 mg via ORAL
  Filled 2017-06-04 (×2): qty 1

## 2017-06-04 MED ORDER — METHOCARBAMOL 500 MG PO TABS: 1000.0000 mg | ORAL_TABLET | Freq: Four times a day (QID) | ORAL | Status: DC | PRN

## 2017-06-04 MED ORDER — NAPROXEN 500 MG PO TABS
500.0000 mg | ORAL_TABLET | Freq: Two times a day (BID) | ORAL | Status: DC
  Administered 2017-06-04 – 2017-06-06 (×4): 500 mg via ORAL
  Filled 2017-06-04 (×4): qty 1

## 2017-06-04 MED ORDER — SODIUM CHLORIDE 0.9 % IV SOLN
250.0000 mL | INTRAVENOUS | Status: DC | PRN
  Administered 2017-06-06: 250 mL via INTRAVENOUS

## 2017-06-04 MED ORDER — SODIUM CHLORIDE 0.9% FLUSH
3.0000 mL | Freq: Two times a day (BID) | INTRAVENOUS | Status: DC
  Administered 2017-06-04 – 2017-06-05 (×4): 3 mL via INTRAVENOUS

## 2017-06-04 NOTE — Progress Notes (Signed)
[address] [place], [place] [address] Phone: [phone]  FAX: [place] [phone] 1981/01/27  CARE TEAM:  PCP: Patient, [name]  Outpatient Care Team: Patient Care Team: Patient, [name] as PCP - General (General Practice)  Inpatient Treatment Team: Treatment Team: Attending Provider: Nolon Nations, MD; Consulting Physician: Edison Pace, Md, MD; Consulting Physician: Fatima Blank, MD; Consulting Physician: Hosie Poisson, MD; Technician: Abbe Amsterdam, NT   Problem List:   Principal Problem:   Perforation of sigmoid colon due to diverticulitis Active Problems:   Uses Spanish as primary spoken language   Leukocytosis      * No surgery found *     Assessment  Recovering  Plan:  -adv diet -follow off IVF -cont IV ABx -bowel regimen -VTE prophylaxis- SCDs, etc -mobilize as tolerated to help recovery  If continues to improve, hopefully transition to oral antibiotics with outpatient follow-up as needed.  If not improved or worsen, may benefit from repeat CT scan to rule out abscess.  So far so good.  20 minutes spent in review, evaluation, examination, counseling, and coordination of care.  More than 50% of that time was spent in counseling.  Willie Robinson, M.D., F.A.C.S. Gastrointestinal and Minimally Invasive Surgery [facility] [address] [place], [place] [address] [phone] Main / Paging   06/04/2017    Subjective: (Chief complaint)  Feels much better.  Much less pain.  Tolerated clear liquids.  Had bowel movements.  Objective:  Vital signs:  Vitals:   06/03/17 0518 06/03/17 1323 06/03/17 2137 06/04/17 0515  BP: 112/82 134/79 (!) 127/99 111/71  Pulse: 84 90 87 81  Resp: _0 Temp: 98.2 F (36.8 C) 97.7 F (36.5 C) 99 F (37.2 C) 98.6 F (37 C)  TempSrc: Oral Oral Oral Oral  SpO2: 99% 96% 97% 97%   Weight:      Height:        Last BM Date: 06/02/17  Intake/Output   Yesterday:  03/02 0701 - 03/03 0700 In: 5726 [P.O.:360; I.V.:1015; IV Piggyback:100] Out: 2035 [Urine:5145] This shift:  No intake/output data recorded.  Bowel function:  Flatus: YES  BM:  YES  Drain: (No drain)   Physical Exam:  General: Pt awake/alert/oriented x4 in no acute distress Eyes: PERRL, normal EOM.  Sclera clear.  No icterus Neuro: CN II-XII intact w/o focal sensory/motor deficits. Lymph: No head/neck/groin lymphadenopathy Psych:  No delerium/psychosis/paranoia HENT: Normocephalic, Mucus membranes moist.  No thrush Neck: Supple, No tracheal deviation Chest: No chest wall pain w good excursion CV:  Pulses intact.  Regular rhythm MS: Normal AROM mjr joints.  No obvious deformity  Abdomen: Soft.  Nondistended.  Nontender.  No evidence of peritonitis.  No incarcerated hernias.  Ext:  No deformity.  No mjr edema.  No cyanosis Skin: No petechiae / purpura  Results:   Labs: Results for orders placed or performed during the hospital encounter of 06/02/17 (from the past 48 hour(s))  Lipase, blood     Status: None   Collection Time: 06/02/17  9:02 PM  Result Value Ref Range   Lipase 31 11 - 51 U/L    Comment: Performed at [facility], [place] [address], [place], [place] [address]  Comprehensive metabolic panel     Status: Abnormal   Collection Time: 06/02/17  9:02 PM  Result Value Ref Range  Sodium 139 135 - 145 mmol/L   Potassium 3.9 3.5 - 5.1 mmol/L   Chloride 105 101 - 111 mmol/L   CO2 26 22 - 32 mmol/L   Glucose, Bld 101 (H) 65 - 99 mg/dL   BUN 18 6 - 20 mg/dL   Creatinine, Ser 0.86 0.61 - 1.24 mg/dL   Calcium 8.9 8.9 - 10.3 mg/dL   Total Protein 8.1 6.5 - 8.1 g/dL   Albumin 4.1 3.5 - 5.0 g/dL   AST 22 15 - 41 U/L   ALT 35 17 - 63 U/L   Alkaline Phosphatase 95 38 - 126 U/L   Total Bilirubin 0.5 0.3 - 1.2 mg/dL   GFR calc non Af Amer >60 >60 mL/min   GFR calc  Af Amer >60 >60 mL/min    Comment: (NOTE) The eGFR has been calculated using the CKD EPI equation. This calculation has not been validated in all clinical situations. eGFR's persistently <60 mL/min signify possible Chronic Kidney Disease.    Anion gap 8 5 - 15    Comment: Performed at [facility], [place] [address] [place], [place] [address]  CBC     Status: Abnormal   Collection Time: 06/02/17  9:02 PM  Result Value Ref Range   WBC 11.2 (H) 4.0 - 10.5 K/uL   RBC 4.99 4.22 - 5.81 MIL/uL   Hemoglobin 14.8 13.0 - 17.0 g/dL   HCT 43.1 39.0 - 52.0 %   MCV 86.4 78.0 - 100.0 fL   MCH 29.7 26.0 - 34.0 pg   MCHC 34.3 30.0 - 36.0 g/dL   RDW 13.2 11.5 - 15.5 %   Platelets 209 150 - 400 K/uL    Comment: Performed at [facility], [place] [address], [place], [place] [address]  Magnesium     Status: None   Collection Time: 06/02/17  9:02 PM  Result Value Ref Range   Magnesium 2.2 1.7 - 2.4 mg/dL    Comment: Performed at [facility], [place] [address], [place], [place] [address]  Urinalysis, Routine w reflex microscopic     Status: None   Collection Time: 06/03/17 12:48 AM  Result Value Ref Range   Color, Urine YELLOW YELLOW   APPearance CLEAR CLEAR   Specific Gravity, Urine 1.028 1.005 - 1.030   pH 6.0 5.0 - 8.0   Glucose, UA NEGATIVE NEGATIVE mg/dL   Hgb urine dipstick NEGATIVE NEGATIVE   Bilirubin Urine NEGATIVE NEGATIVE   Ketones, ur NEGATIVE NEGATIVE mg/dL   Protein, ur NEGATIVE NEGATIVE mg/dL   Nitrite NEGATIVE NEGATIVE   Leukocytes, UA NEGATIVE NEGATIVE    Comment: Performed at [place] [address], [place], [place] [address]  CBC     Status: None   Collection Time: 06/03/17  4:32 AM  Result Value Ref Range   WBC 10.0 4.0 - 10.5 K/uL   RBC 4.58 4.22 - 5.81 MIL/uL   Hemoglobin 13.5 13.0 - 17.0 g/dL   HCT 39.5 39.0 - 52.0 %   MCV 86.2 78.0 - 100.0 fL   MCH 29.5 26.0 - 34.0 pg   MCHC 34.2 30.0 -  36.0 g/dL   RDW 13.3 11.5 - 15.5 %   Platelets 191 150 - 400 K/uL    Comment: Performed at [facility], [place] [address] [place], [place] [address]  HIV antibody (Routine Testing)     Status: None   Collection Time: 06/03/17  4:32 AM  Result Value Ref  Range   HIV Screen 4th Generation wRfx Non Reactive Non Reactive    Comment: (NOTE) Performed At: [facility] [place], [place] [id number] Rush Farmer MD CB:[id number] Performed at [facility], [place] [address], [place], [place] [address]   Basic metabolic panel     Status: Abnormal   Collection Time: 06/03/17  4:32 AM  Result Value Ref Range   Sodium 139 135 - 145 mmol/L   Potassium 3.7 3.5 - 5.1 mmol/L   Chloride 107 101 - 111 mmol/L   CO2 25 22 - 32 mmol/L   Glucose, Bld 108 (H) 65 - 99 mg/dL   BUN 11 6 - 20 mg/dL   Creatinine, Ser 0.70 0.61 - 1.24 mg/dL   Calcium 8.4 (L) 8.9 - 10.3 mg/dL   GFR calc non Af Amer >60 >60 mL/min   GFR calc Af Amer >60 >60 mL/min    Comment: (NOTE) The eGFR has been calculated using the CKD EPI equation. This calculation has not been validated in all clinical situations. eGFR's persistently <60 mL/min signify possible Chronic Kidney Disease.    Anion gap 7 5 - 15    Comment: Performed at [facility], [place] [address], [place], [place] [address]    Imaging / Studies: Ct Abdomen Pelvis W Contrast  Result Date: 06/03/2017 CLINICAL DATA:  Right lower quadrant pain for 1 week. EXAM: CT ABDOMEN AND PELVIS WITH CONTRAST TECHNIQUE: Multidetector CT imaging of the abdomen and pelvis was performed using the standard protocol following bolus administration of intravenous contrast. CONTRAST:  100 ml ISOVUE-300 IOPAMIDOL (ISOVUE-300) INJECTION 61% COMPARISON:  None. FINDINGS: Lower chest: There is dependent atelectasis in the lung bases. No pleural or pericardial effusion. Hepatobiliary: The liver is low attenuating consistent  with fatty infiltration. No focal lesion. The gallbladder and biliary tree appear normal. Pancreas: Unremarkable. No pancreatic ductal dilatation or surrounding inflammatory changes. Spleen: Normal in size without focal abnormality. Adrenals/Urinary Tract: Adrenal glands are unremarkable. Kidneys are normal, without renal calculi, focal lesion, or hydronephrosis. Bladder is unremarkable. Stomach/Bowel: The patient has sigmoid diverticulosis. There is wall thickening and stranding about the mid sigmoid colon consistent with superimposed acute diverticulitis. No abscess. A small contained perforation is identified with a pocket of air measuring 2.0 cm craniocaudal by 1.5 cm transverse by 1 cm AP. The stomach, small bowel and appendix appear normal. Vascular/Lymphatic: No significant vascular findings are present. No enlarged abdominal or pelvic lymph nodes. Reproductive: Prostate is unremarkable. Other: No fluid collection.  Fat containing umbilical hernia noted. Musculoskeletal: Negative. IMPRESSION: Acute sigmoid diverticulitis with a small contained perforation identified. Negative for abscess. Fatty infiltration of the liver. Electronically Signed   By: Inge Rise M.D.   On: 06/03/2017 00:21    Medications / Allergies: per chart  Antibiotics: Anti-infectives (From admission, onward)   Start     Dose/Rate Route Frequency Ordered Stop   06/03/17 0800  piperacillin-tazobactam (ZOSYN) IVPB 3.375 g  Status:  Discontinued     3.375 g 12.5 mL/hr over 240 Minutes Intravenous Every 8 hours 06/03/17 0212 06/03/17 0224   06/03/17 0600  ciprofloxacin (CIPRO) IVPB 400 mg     400 mg 200 mL/hr over 60 Minutes Intravenous Every 12 hours 06/03/17 0159     06/03/17 0600  metroNIDAZOLE (FLAGYL) IVPB 500 mg     500 mg 100 mL/hr over 60 Minutes Intravenous Every 8 hours 06/03/17 0159     06/03/17 0115  piperacillin-tazobactam (ZOSYN) IVPB 3.375 g  3.375 g 100 mL/hr over 30 Minutes Intravenous  Once 06/03/17  0109 06/03/17 0152        Note: Portions of this report may have been transcribed using voice recognition software. Every effort was made to ensure accuracy; however, inadvertent computerized transcription errors may be present.   Any transcriptional errors that result from this process are unintentional.     Willie Robinson, M.D., F.A.C.S. Gastrointestinal and Minimally Invasive Surgery [facility] [address] [place], [place] [address] [phone] Main / Paging   06/04/2017

## 2017-06-05 LAB — CREATININE, SERUM
Creatinine, Ser: 0.78 mg/dL (ref 0.61–1.24)
GFR calc Af Amer: >60 mL/min (ref >60)
GFR calc non Af Amer: >60 mL/min (ref >60)

## 2017-06-05 LAB — CBC
HEMATOCRIT: 42.8 % (ref 39.0–52.0)
Hemoglobin: 14.8 g/dL (ref 13.0–17.0)
MCH: 29.2 pg (ref 26.0–34.0)
MCHC: 34.6 g/dL (ref 30.0–36.0)
MCV: 84.6 fL (ref 78.0–100.0)
PLATELETS: 222 10*3/uL (ref 150–400)
RBC: 5.06 MIL/uL (ref 4.22–5.81)
RDW: 12.9 % (ref 11.5–15.5)
WBC: 6.9 10*3/uL (ref 4.0–10.5)

## 2017-06-05 LAB — POTASSIUM: Potassium: 4 mmol/L (ref 3.5–5.1)

## 2017-06-05 MED ORDER — METRONIDAZOLE 500 MG PO TABS
500.0000 mg | ORAL_TABLET | Freq: Two times a day (BID) | ORAL | Status: DC
  Administered 2017-06-05 – 2017-06-06 (×2): 500 mg via ORAL
  Filled 2017-06-05 (×2): qty 1

## 2017-06-05 MED ORDER — CIPROFLOXACIN HCL 500 MG PO TABS
500.0000 mg | ORAL_TABLET | Freq: Two times a day (BID) | ORAL | Status: DC
  Administered 2017-06-05 – 2017-06-06 (×2): 500 mg via ORAL
  Filled 2017-06-05 (×2): qty 1

## 2017-06-05 NOTE — Progress Notes (Signed)
[facility] Progress Note     Subjective: CC: diverticulitis Patient with very mild abdominal pain that he describes as a pressure with sneezing or lots of movement. Patient denies abdominal pain at rest. Tolerating diet, denies n/v. Patient very concerned about microperforation and wanting to know if he can have some kind of imaging to make sure there is no leak prior to discharge.  UOP good. VSS.   Objective: Vital signs in last 24 hours: Temp:  [97.5 F (36.4 C)-98.8 F (37.1 C)] 98.8 F (37.1 C) (03/04 0636) Pulse Rate:  [70-86] 86 (03/04 0636) Resp:  [18-19] 18 (03/04 0636) BP: (105-122)/(58-88) 122/87 (03/04 0636) SpO2:  [88 %-97 %] 96 % (03/04 0636) Last BM Date: 06/02/17  Intake/Output from previous day: 03/03 0701 - 03/04 0700 In: 1979.7 [P.O.:600; I.V.:579.7; IV Piggyback:800] Out: 2250 [Urine:2250] Intake/Output this shift: No intake/output data recorded.  PE: Gen:  Alert, NAD, pleasant Card:  Regular rate and rhythm, pedal pulses 2+ BL Pulm:  Normal effort, clear to auscultation bilaterally Abd: Soft, mildly tender to deep palpation of suprapubic region, non-distended, bowel sounds present, no HSM Skin: warm and dry, no rashes  Psych: A&Ox3   Lab Results:  Recent Labs    06/03/17 0432 06/05/17 0513  WBC 10.0 6.9  HGB 13.5 14.8  HCT 39.5 42.8  PLT 191 222   BMET Recent Labs    06/02/17 2102 06/03/17 0432 06/05/17 0513  NA 139 139  --   K 3.9 3.7 4.0  CL 105 107  --   CO2 26 25  --   GLUCOSE 101* 108*  --   BUN 18 11  --   CREATININE 0.86 0.70 0.78  CALCIUM 8.9 8.4*  --    PT/INR No results for input(s): LABPROT, INR in the last 72 hours. CMP     Component Value Date/Time   NA 139 06/03/2017 0432   K 4.0 06/05/2017 0513   CL 107 06/03/2017 0432   CO2 25 06/03/2017 0432   GLUCOSE 108 (H) 06/03/2017 0432   BUN 11 06/03/2017 0432   CREATININE 0.78 06/05/2017 0513   CALCIUM 8.4 (L) 06/03/2017 0432   PROT 8.1 06/02/2017 2102    ALBUMIN 4.1 06/02/2017 2102   AST 22 06/02/2017 2102   ALT 35 06/02/2017 2102   ALKPHOS 95 06/02/2017 2102   BILITOT 0.5 06/02/2017 2102   GFRNONAA >60 06/05/2017 0513   GFRAA >60 06/05/2017 0513   Lipase     Component Value Date/Time   LIPASE 31 06/02/2017 2102       Studies/Results: No results found.  Anti-infectives: Anti-infectives (From admission, onward)   Start     Dose/Rate Route Frequency Ordered Stop   06/03/17 0800  piperacillin-tazobactam (ZOSYN) IVPB 3.375 g  Status:  Discontinued     3.375 g 12.5 mL/hr over 240 Minutes Intravenous Every 8 hours 06/03/17 0212 06/03/17 0224   06/03/17 0600  ciprofloxacin (CIPRO) IVPB 400 mg     400 mg 200 mL/hr over 60 Minutes Intravenous Every 12 hours 06/03/17 0159     06/03/17 0600  metroNIDAZOLE (FLAGYL) IVPB 500 mg     500 mg 100 mL/hr over 60 Minutes Intravenous Every 8 hours 06/03/17 0159     06/03/17 0115  piperacillin-tazobactam (ZOSYN) IVPB 3.375 g     3.375 g 100 mL/hr over 30 Minutes Intravenous  Once 06/03/17 0109 06/03/17 0152       Assessment/Plan Sigmoid diverticulitis with perforation  - WBC 6.9, afebrile - patient  tolerating diet and having bowel function - will transition to PO abx - possible imaging per patient request, will discuss with MD  FEN: diet HH  VTE: SCDs, lovenox ID: IV zosyn 3/2; IV Cipro/flagyl 3/2>>  Dispo: home on PO abx possibly later today or tomorrow AM  LOS: 2 days    Wells Guiles , [facility] 06/05/2017, 10:23 AM Pager: [phone] Consults: [phone] Mon-Fri 7:00 am-4:30 pm Sat-Sun 7:00 am-11:30 am

## 2017-06-06 LAB — CBC
HEMATOCRIT: 42.6 % (ref 39.0–52.0)
HEMOGLOBIN: 14.9 g/dL (ref 13.0–17.0)
MCH: 29.5 pg (ref 26.0–34.0)
MCHC: 35 g/dL (ref 30.0–36.0)
MCV: 84.4 fL (ref 78.0–100.0)
Platelets: 225 10*3/uL (ref 150–400)
RBC: 5.05 MIL/uL (ref 4.22–5.81)
RDW: 12.8 % (ref 11.5–15.5)
WBC: 7.4 10*3/uL (ref 4.0–10.5)

## 2017-06-06 MED ORDER — ACETAMINOPHEN 325 MG PO TABS: 325.0000 mg | ORAL_TABLET | Freq: Four times a day (QID) | ORAL | Status: AC | PRN

## 2017-06-06 MED ORDER — METRONIDAZOLE 500 MG PO TABS: 500.0000 mg | ORAL_TABLET | Freq: Two times a day (BID) | ORAL | 0 refills | Status: AC

## 2017-06-06 MED ORDER — CIPROFLOXACIN HCL 500 MG PO TABS: 500.0000 mg | ORAL_TABLET | Freq: Two times a day (BID) | ORAL | 0 refills | Status: AC

## 2017-06-06 MED ORDER — SACCHAROMYCES BOULARDII 250 MG PO CAPS: 250.0000 mg | ORAL_CAPSULE | Freq: Two times a day (BID) | ORAL | Status: AC

## 2017-06-06 MED ORDER — PSYLLIUM 95 % PO PACK: 1.0000 | PACK | Freq: Every day | ORAL | Status: AC

## 2017-06-06 NOTE — Progress Notes (Signed)
   06/06/17 0957  Clinical Encounter Type  Visited With Patient  Visit Type Initial  Referral From Nurse  Consult/Referral To Chaplain  Stress Factors  Patient Stress Factors Family relationships   Responded to a SCC for information on a HCPA.  Patient was sitting up on the couch eating breakfast.  He indicated he was not interested in a HCPA at this time.  Is hoping to be discharged soon.  Indicated he [place] ren[place] a room in [place] and hopeful the room is still available (feels confident he has that place to go) Has a positive outlook and ready to get better.  Will follow and support as needed. Chaplain Agustin CreeNewton Breelynn Bankert

## 2017-06-06 NOTE — Discharge Instructions (Signed)
Diverticulitis Diverticulitis La diverticulitis es la infeccin o inflamacin de pequeas bolsas (divertculos) en el colon que se forman por una afeccin llamada diverticulosis. Los divertculos pueden atrapar las heces (materia fecal) y las bacterias, lo cual causa infeccin e inflamacin. La diverticulitis puede causar dolor intenso de estmago y St. Marysdiarrea. Puede causar daos en los tejidos del colon que provocan sangrado. Los divertculos tambin pueden explotar (ruptura) y causar que las heces infectadas ingresen a otras reas del abdomen. Entre las complicaciones de la diverticulitis, se incluyen las siguientes:  Hemorragia.  Infeccin grave.  Dolor intenso.  Ruptura (perforacin) del colon.  Bloqueo (obstruccin) del colon.  Cules son las causas? Esta afeccin es causada por las heces que quedan atrapadas en los divertculos, lo cual permite que las bacterias crezcan en ellos. Esto causa inflamacin e infeccin. Qu incrementa el riesgo? Es ms probable que tenga esta afeccin si:  Tiene diverticulosis. El riesgo de tener diverticulosis aumenta si usted: ? Tiene sobrepeso u es obeso. ? Consume productos que contienen tabaco. ? No realiza suficiente actividad fsica.  Lleva una dieta que no incluye la suficiente cantidad [place] fibra. Los alimentos ricos en fibra incluyen frutas, verduras, frijoles, frutos secos y cereales integrales.  Cules son los signos o los sntomas? Los sntomas de esta afeccin pueden incluir los siguientes:  Dolor y sensibilidad en el abdomen. El dolor en general se siente del lado izquierdo del abdomen, pero puede sentirse en otras zonas.  Grant RutsFiebre y escalofros.  Meteorismo.  Calambres.  Nuseas.  Vmitos.  Cambios en los hbitos intestinales.  Sangre en las heces.  Cmo se diagnostica? Esta afeccin se diagnostica en funcin de lo siguiente:  Sus antecedentes mdicos.  Un examen fsico.  Pruebas para descartar otros motivos que  causen la afeccin. Estos estudios pueden Starr Urias & Johnsonincluir los siguientes: ? Anlisis de Irondalesangre. ? Anlisis de [place]. ? Estudios por imgenes del abdomen, entre ellos, radiografas, ecografas, resonancias magnticas (RM) o exploraciones por tomografa computarizada (TC).  Cmo se trata? La mayora de los casos de esta afeccin son leves y pueden tratarse Facilities manageren el hogar. El tratamiento puede incluir lo siguiente:  Tomar analgsicos de Sales promotion account executiveventa libre.  Seguir una dieta lquida absoluta.  Tomar antibiticos por va oral.  Hacer reposo.  Puede que los casos ms graves deban tratarse en el hospital. El tratamiento puede incluir lo siguiente:  No comer ni beber nada.  Tomar analgsicos recetados.  Recibir antibiticos a travs de un tubo (catter) intravenoso.  Recibir lquidos y alimentos a travs de un tubo (catter) intravenoso.  Ciruga.  Cuando la afeccin est controlada, el mdico puede recomendarle que se haga una colonoscopia. Se trata de un examen que se realiza para examinar todo el intestino grueso. Durante el examen, se introduce un tubo lubricado y flexible en el ano que luego se lleva hasta el recto, el colon y otras partes del intestino grueso. Mediante una colonoscopia, se puede determinar la gravedad de los divertculos y si hay algo ms que pueda estar causando los sntomas. Siga estas indicaciones en su casa: Medicamentos  Baxter Internationalome los medicamentos de venta libre y los recetados solamente como se lo haya indicado el mdico. Estos incluyen suplementos de [place]fibra, probiticos y laxantes.  Si le recetaron un antibitico, tmelo como se lo haya indicado el mdico. No deje de tomar los antibiticos aunque comience a Actorsentirse mejor.  No conduzca ni opere maquinaria pesada mientras toma analgsicos recetados. Instrucciones generales  Siga las indicaciones del mdico acerca de si debe seguir una dieta lquida o  de otro tipo. Cuando los sntomas mejoren, el mdico puede indicarle que  modifique la dieta. Puede recomendarle que consuma una dieta con al menos 25g (25gramos) de fibra a diario. La fibra facilita la eliminacin de heces. Las fuentes saludables de fibra incluyen: ? Frutos rojos. Una taza contiene de 4 a 8gramos de [place]. ? Frijoles y lentejas. Media taza contiene de 5 a 8gramos de [place]. ? Vegetales de [employer]. Una taza contiene 4gramos de [place].  Realizar al menos de actividad fsica diaria, 3veces por semana. El ejercicio debe tener la intensidad suficiente como para aumentar la frecuencia cardaca y Emergency planning/management officer.  Concurra a todas las visitas de [address] se lo haya indicado el mdico. Esto es importante. Es posible que necesite una colonoscopia. Comunquese con un mdico si:  El dolor no mejora.  Le resulta difcil beber o comer.  Las deposiciones no se normalizan. Solicite ayuda de inmediato si:  El Product/process development scientist.  Los sntomas no mejoran con Scientist, research (medical).  Los sntomas empeoran repentinamente.  Tiene fiebre.  Vomita ms de una vez.  Las heces son sanguinolentas, negras o alquitranadas. Resumen  La diverticulitis es la infeccin o inflamacin de pequeas bolsas (divertculos) en el colon que se forman por una afeccin llamada diverticulosis. Los divertculos pueden atrapar las heces (materia fecal) y las bacterias, lo cual causa infeccin e inflamacin.  Usted tiene un riesgo mayor de tener esta afeccin si sufre de diverticulosis y sigue una dieta que no incluye la suficiente cantidad de [place].  La mayora de los casos de esta afeccin son leves y pueden tratarse Facilities manager. Puede que los casos ms graves deban tratarse en el hospital.  Cuando la afeccin est controlada, el mdico puede recomendarle que se haga un examen llamado colonoscopia. Este examen puede mostrar la gravedad de los divertculos y si hay algo ms que pueda estar causando los sntomas. Esta informacin no tiene Theme park manager el consejo  del mdico. Asegrese de hacerle al mdico cualquier pregunta que tenga. Document Released: 12/29/2004 Document Revised: 07/11/2016 Document Reviewed: 07/11/2016 Elsevier Interactive Patient Education  2018 [employer].  Call Medical records if you would like copies of any imaging or documents related to your care. [phone]

## 2017-06-06 NOTE — Discharge Summary (Signed)
[facility] Discharge Summary   Patient ID: Willie AbtsManuel Do Santo MRN: [id number] DOB/AGE: 11-21-1980 37 y.o.  Admit date: 06/02/2017 Discharge date: 06/06/2017  Admitting Diagnosis: Sigmoid diverticulitis with contained microperforation  Discharge Diagnosis Patient Active Problem List   Diagnosis Date Noted  . Uses Spanish as primary spoken language 06/03/2017  . Perforation of sigmoid colon due to diverticulitis 06/03/2017  . Leukocytosis 06/03/2017    Consultants None  Imaging: No results found.  Procedures None  Hospital Course:  Patient is a 37 year old [facility].  Workup showed sigmoid diverticulitis with contained perforation.  Patient was admitted for treatment with IV antibiotics. Diet was advanced as tolerated and patient was transitioned to PO antibiotics.  On 06/06/2017, the patient was voiding well, tolerating diet, ambulating well, pain well controlled, vital signs stable and felt stable for discharge home.  Patient will follow up in our office in 2-3 weeks and knows to call with questions or concerns.  He will call to confirm appointment date/time.    Physical Exam: Gen:  Alert, NAD, pleasant Card:  Regular rate and rhythm, pedal pulses 2+ BL Pulm:  Normal effort, clear to auscultation bilaterally Abd: Soft, non-tender, non-distended, bowel sounds present, no HSM Skin: warm and dry, no rashes  Psych: A&Ox3    Allergies as of 06/06/2017   No Known Allergies     Medication List    TAKE these medications   acetaminophen 325 MG tablet Commonly known as:  TYLENOL Take 1-2 tablets (325-650 mg total) by mouth every 6 (six) hours as needed for mild pain, fever or headache.   ciprofloxacin 500 MG tablet Commonly known as:  CIPRO Take 1 tablet (500 mg total) by mouth 2 (two) times daily for 11 days.   ibuprofen 200 MG tablet Commonly known as:  ADVIL,MOTRIN Take 400 mg by mouth every 6 (six) hours as needed for headache or  moderate pain.   metroNIDAZOLE 500 MG tablet Commonly known as:  FLAGYL Take 1 tablet (500 mg total) by mouth every 12 (twelve) hours for 11 days.   psyllium 95 % Pack Commonly known as:  HYDROCIL/METAMUCIL Take 1 packet by mouth daily.   saccharomyces boulardii 250 MG capsule Commonly known as:  FLORASTOR Take 1 capsule (250 mg total) by mouth 2 (two) times daily.        Follow-up Information    [facility], [facility]. Call.   Why:  Call for an appointment to be seen for follow up for diverticulitis in 6-8 weeks. Contact information: [address] [place] [address] [phone]        [facility]. Call.   Specialty:  Gastroenterology Why:  Call for an appointment to be seen for follow up for diverticulitis in 6-8 weeks.  Contact information: [address] [place] [phone]       Karie SodaGross, Steven, MD Follow up.   Specialty:  General Surgery Why:  Call for an appointment in 2-3 weeks for follow up on non-operative management of diverticulitis.  Contact information: [address] [place] [address] [phone]           Signed: Wells GuilesKelly Ray[place], [facility] 06/06/2017, 9:42 AM Pager: [phone] Mon-Fri 7:00 am-4:30 pm Sat-Sun 7:00 am-11:30 am

## 2017-06-06 NOTE — Progress Notes (Signed)
Pt alert, oriented, and tolerating diet.  D/C instructions and prescriptions were given, all questions were answered. Pt was d/cd home.

## 2017-06-26 ENCOUNTER — Ambulatory Visit: Payer: Self-pay | Admitting: Surgery

## 2017-07-14 ENCOUNTER — Other Ambulatory Visit: Payer: Self-pay | Admitting: Gastroenterology

## 2017-07-14 DIAGNOSIS — K5732 Diverticulitis of large intestine without perforation or abscess without bleeding: Secondary | ICD-10-CM

## 2017-07-21 ENCOUNTER — Other Ambulatory Visit: Payer: Self-pay

## 2017-07-28 ENCOUNTER — Ambulatory Visit
Admission: RE | Admit: 2017-07-28 | Discharge: 2017-07-28 | Disposition: A | Discharge status: Home / Self Care | Payer: No Typology Code available for payment source | Source: Ambulatory Visit | Attending: Gastroenterology | Admitting: Gastroenterology

## 2017-07-28 DIAGNOSIS — K5732 Diverticulitis of large intestine without perforation or abscess without bleeding: Secondary | ICD-10-CM

## 2017-07-28 MED ORDER — IOPAMIDOL (ISOVUE-300) INJECTION 61%
100.0000 mL | Freq: Once | INTRAVENOUS | Status: AC | PRN
  Administered 2017-07-28: 100 mL via INTRAVENOUS

## 2018-07-11 IMAGING — CT CT ABD-PELV W/ CM
2 of 4 series · 15 of 46 positions shown, 17 images · IV contrast (iopamidol)
Comparison: 06/03/2017

CLINICAL DATA: Followup complicated sigmoid colon diverticulitis.

EXAM:
CT ABDOMEN AND PELVIS WITH CONTRAST
TECHNIQUE: Multidetector CT imaging of the abdomen and pelvis was performed
using the standard protocol following bolus administration of
intravenous contrast.
CONTRAST:  100mL [id number] IOPAMIDOL ([id number]) INJECTION 61%

[Series 2: abd pelvis 5.00 br40 s3 ax · axial · 0.70mm/px · z∈[+1144,+1584]mm · 12 of 101 slices shown, 14 images]
[im 9/101  soft-tissue]
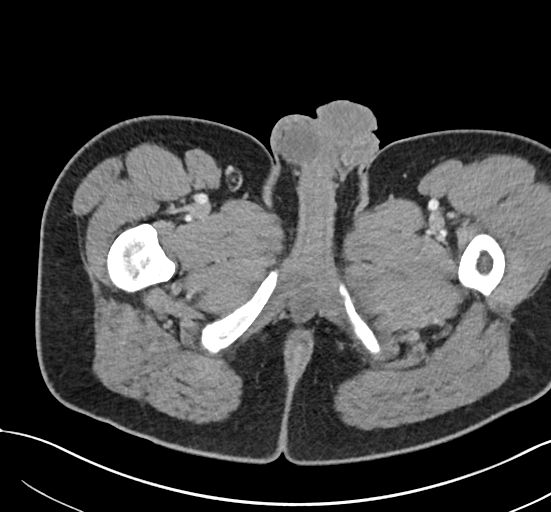
[im 9/101  bone]
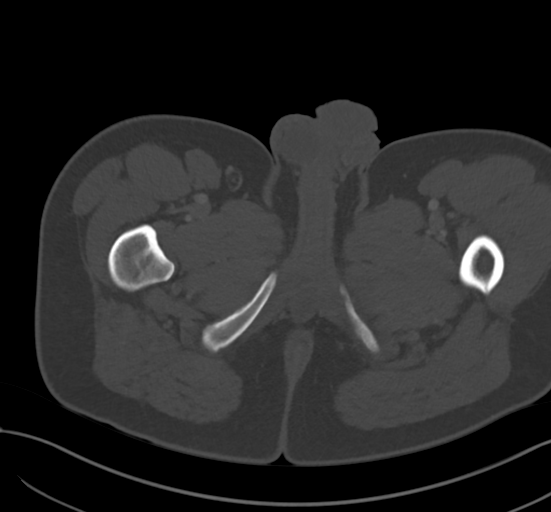
[im 17/101  soft-tissue]
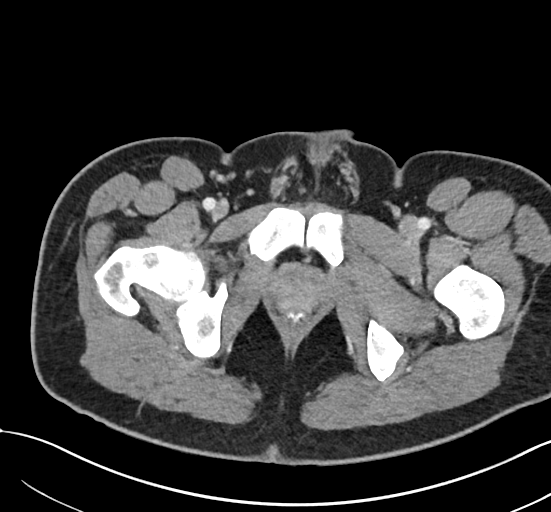
[im 25/101  soft-tissue]
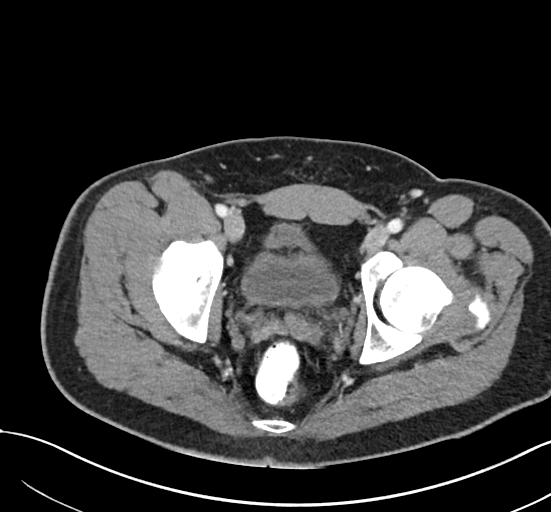
[im 33/101  soft-tissue]
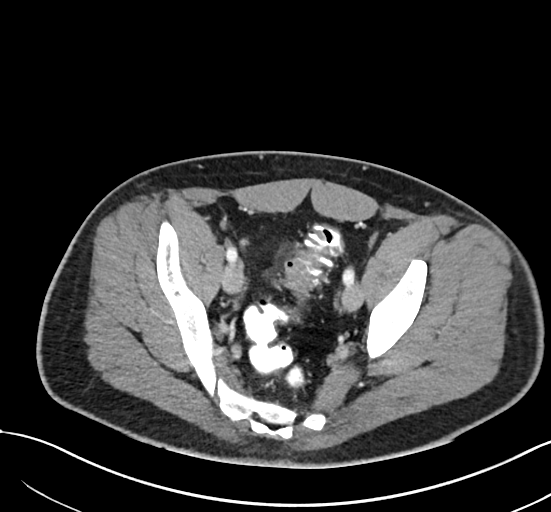
[im 41/101  soft-tissue]
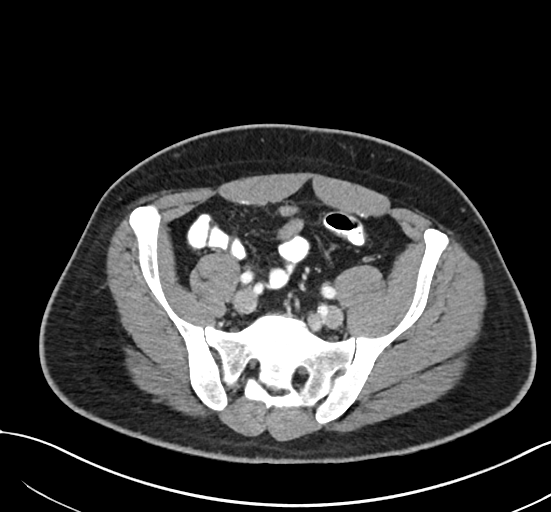
[im 49/101  soft-tissue]
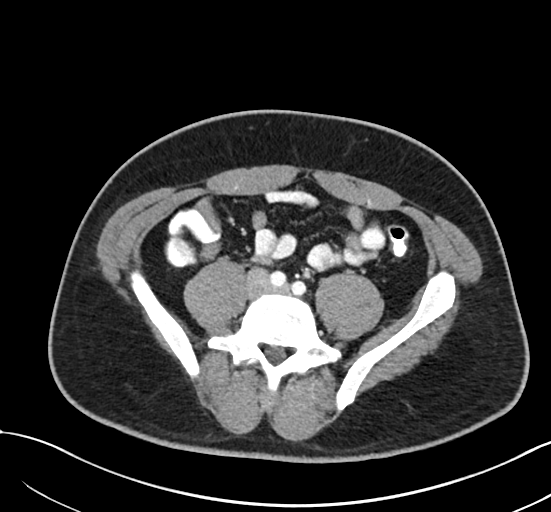
[im 57/101  soft-tissue]
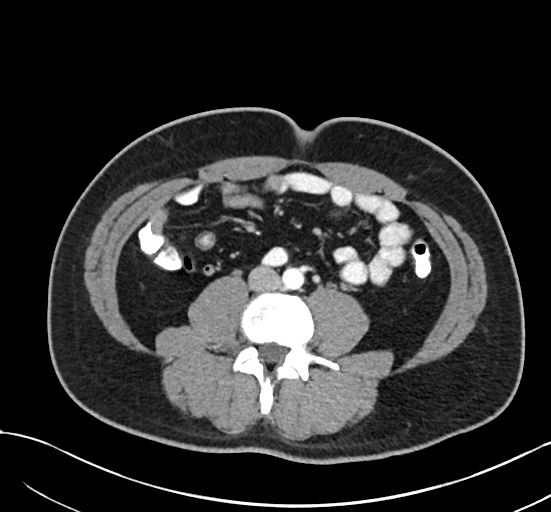
[im 65/101  soft-tissue]
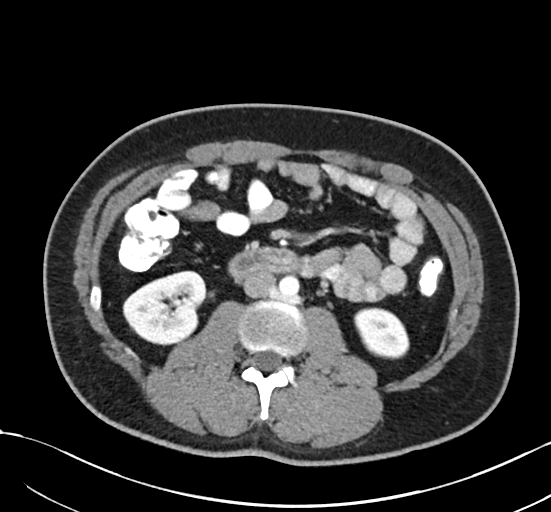
[im 73/101  soft-tissue]
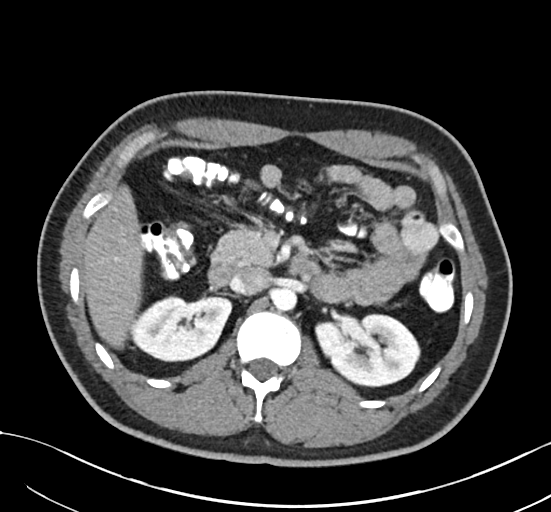
[im 73/101  bone]
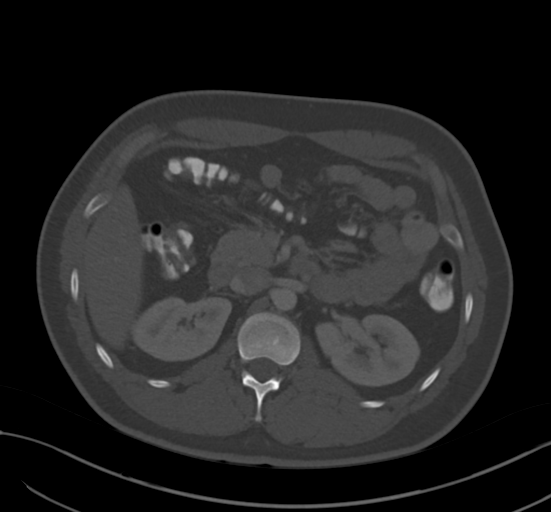
[im 81/101  soft-tissue]
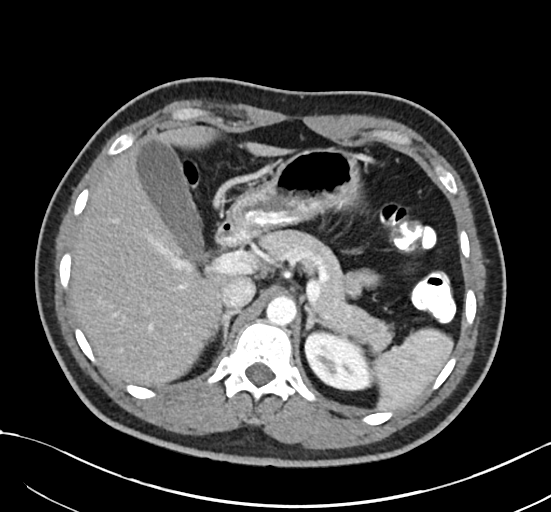
[im 89/101  soft-tissue]
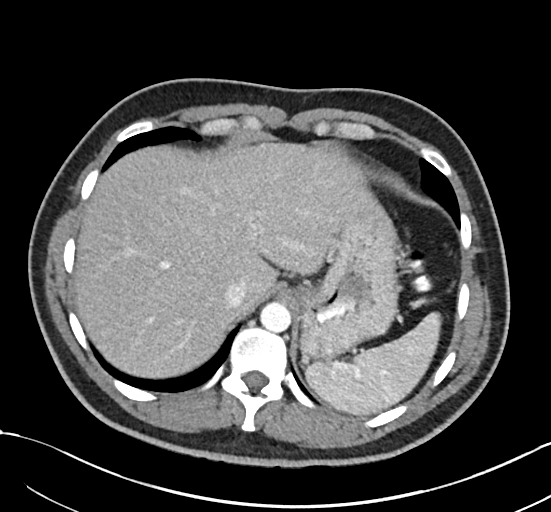
[im 97/101  soft-tissue]
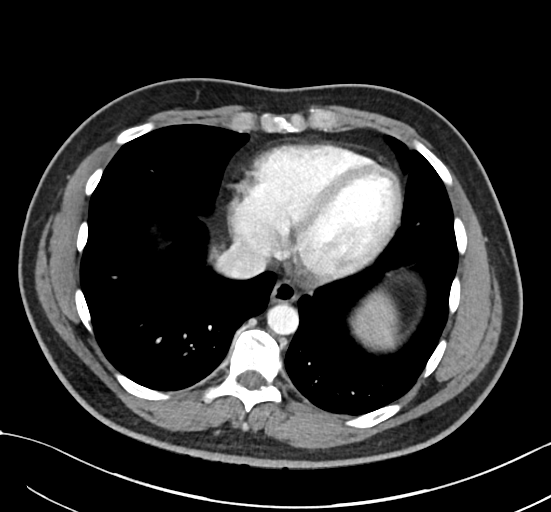

[Series 6: abd pelvis 2.00 br40 s3 cor · coronal · 0.73mm/px · 3 of 138 slices shown]
[im 46/138  soft-tissue]
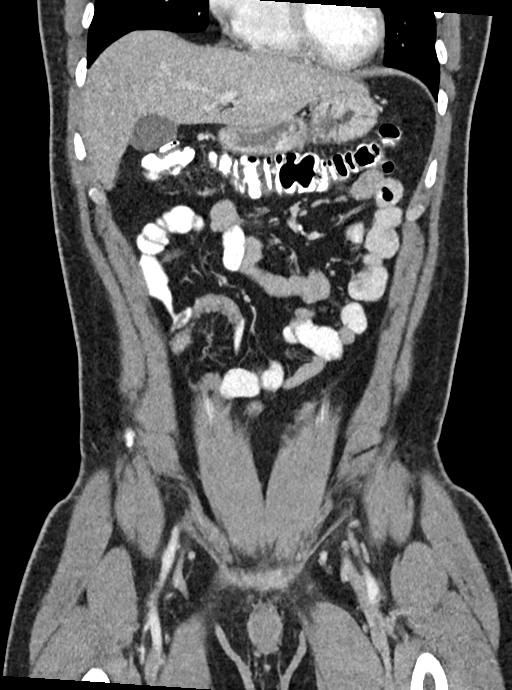
[im 61/138  soft-tissue]
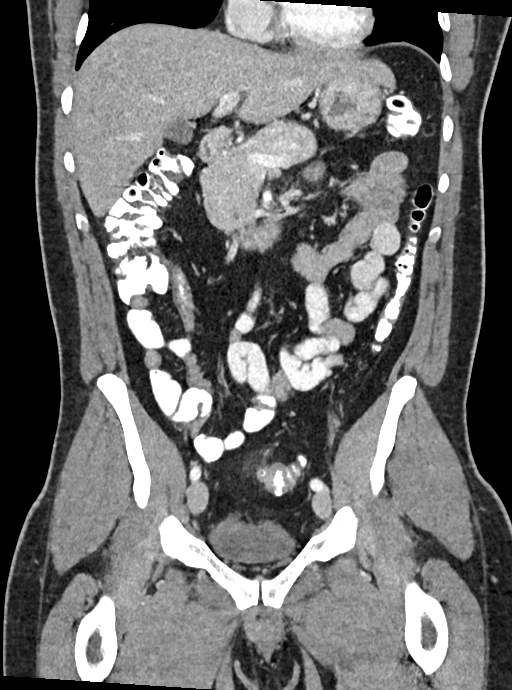
[im 77/138  soft-tissue]
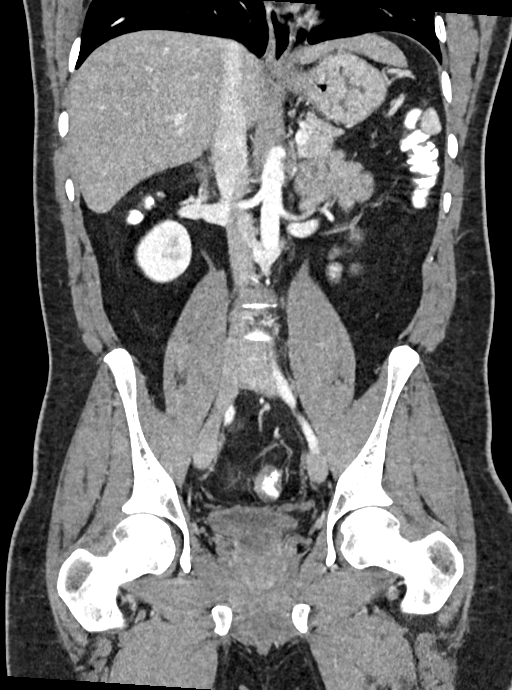

[15 of 46 positions shown; findings below may reference images not displayed]

FINDINGS: Lower chest: The lung bases are clear of acute process. No pleural
effusion or pulmonary lesions. The heart is normal in size. No
pericardial effusion. The distal esophagus and aorta are
unremarkable.

Hepatobiliary: No focal hepatic lesions or intrahepatic biliary
dilatation the gallbladder is normal. No common bile duct
dilatation..

Pancreas: No mass, inflammation or ductal dilatation.

Spleen: Normal size.  No focal lesions.

Adrenals/Urinary Tract: The adrenal glands and kidneys are
unremarkable.

Stomach/Bowel: The stomach, duodenum, and small bowel are
unremarkable. The terminal ileum is normal. The appendix is normal.

The sigmoid colon demonstrates some residual inflammatory changes
with slight wall thickening but the small focal perforation has
resolved and is no complicating features such as abscess or fistula.
No free air.

Vascular/Lymphatic: The aorta is normal in caliber. No dissection.
The branch vessels are patent. The major venous structures are
patent. No mesenteric or retroperitoneal mass or adenopathy. Small
scattered lymph nodes are noted.

Reproductive: The prostate gland and seminal vesicles are
unremarkable.

Other: No pelvic mass or adenopathy. No free pelvic fluid
collections. No inguinal mass or adenopathy. No abdominal wall
hernia or subcutaneous lesions.

Musculoskeletal: No significant bony findings.
IMPRESSION: Some residual inflammation of the sigmoid colon with mild wall
thickening but resolution of the small focal perforation. No abscess
or free air.
